# Patient Record
Sex: Female | Born: 1990
Health system: Southern US, Community
[De-identification: ages and names within clinical notes are randomized; demographics above are authoritative.]

## PROBLEM LIST (undated history)

## (undated) ENCOUNTER — Ambulatory Visit: Admission: EM | Payer: 59 | Source: Home / Self Care

## (undated) DIAGNOSIS — R202 Paresthesia of skin: Secondary | ICD-10-CM

## (undated) DIAGNOSIS — N760 Acute vaginitis: Secondary | ICD-10-CM

## (undated) DIAGNOSIS — A599 Trichomoniasis, unspecified: Secondary | ICD-10-CM

## (undated) DIAGNOSIS — A749 Chlamydial infection, unspecified: Secondary | ICD-10-CM

## (undated) DIAGNOSIS — G709 Myoneural disorder, unspecified: Secondary | ICD-10-CM

## (undated) DIAGNOSIS — J40 Bronchitis, not specified as acute or chronic: Secondary | ICD-10-CM

## (undated) DIAGNOSIS — B9689 Other specified bacterial agents as the cause of diseases classified elsewhere: Secondary | ICD-10-CM

## (undated) DIAGNOSIS — L732 Hidradenitis suppurativa: Secondary | ICD-10-CM

## (undated) DIAGNOSIS — K219 Gastro-esophageal reflux disease without esophagitis: Secondary | ICD-10-CM

## (undated) DIAGNOSIS — N390 Urinary tract infection, site not specified: Secondary | ICD-10-CM

## (undated) DIAGNOSIS — A549 Gonococcal infection, unspecified: Secondary | ICD-10-CM

## (undated) HISTORY — PX: SALPINGOOPHORECTOMY: SHX82

---
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
## 2012-03-06 ENCOUNTER — Encounter (HOSPITAL_COMMUNITY): Payer: Self-pay | Admitting: *Deleted

## 2012-03-06 ENCOUNTER — Emergency Department (HOSPITAL_COMMUNITY): Payer: Self-pay

## 2012-03-06 ENCOUNTER — Emergency Department (HOSPITAL_COMMUNITY)
Admission: EM | Admit: 2012-03-06 | Discharge: 2012-03-06 | Disposition: A | Discharge status: Home / Self Care | Payer: Self-pay | Attending: Emergency Medicine | Admitting: Emergency Medicine

## 2012-03-06 DIAGNOSIS — R51 Headache: Secondary | ICD-10-CM | POA: Insufficient documentation

## 2012-03-06 DIAGNOSIS — R0789 Other chest pain: Secondary | ICD-10-CM | POA: Insufficient documentation

## 2012-03-06 DIAGNOSIS — E119 Type 2 diabetes mellitus without complications: Secondary | ICD-10-CM | POA: Insufficient documentation

## 2012-03-06 DIAGNOSIS — Z880 Allergy status to penicillin: Secondary | ICD-10-CM | POA: Insufficient documentation

## 2012-03-06 DIAGNOSIS — E669 Obesity, unspecified: Secondary | ICD-10-CM | POA: Insufficient documentation

## 2012-03-06 DIAGNOSIS — F17200 Nicotine dependence, unspecified, uncomplicated: Secondary | ICD-10-CM | POA: Insufficient documentation

## 2012-03-06 DIAGNOSIS — N949 Unspecified condition associated with female genital organs and menstrual cycle: Secondary | ICD-10-CM | POA: Insufficient documentation

## 2012-03-06 LAB — URINALYSIS, ROUTINE W REFLEX MICROSCOPIC
Bilirubin Urine: NEGATIVE
Hgb urine dipstick: NEGATIVE
Ketones, ur: NEGATIVE mg/dL
Protein, ur: NEGATIVE mg/dL
Urobilinogen, UA: 0.2 mg/dL (ref 0.0–1.0)

## 2012-03-06 MED ORDER — SODIUM CHLORIDE 0.9 % IV BOLUS (SEPSIS)
1000.0000 mL | INTRAVENOUS | Status: AC
  Administered 2012-03-06: 1000 mL via INTRAVENOUS

## 2012-03-06 MED ORDER — KETOROLAC TROMETHAMINE 30 MG/ML IJ SOLN
30.0000 mg | Freq: Once | INTRAMUSCULAR | Status: AC
  Administered 2012-03-06: 30 mg via INTRAVENOUS
  Filled 2012-03-06: qty 1

## 2012-03-06 MED ORDER — DIPHENHYDRAMINE HCL 50 MG/ML IJ SOLN
25.0000 mg | Freq: Once | INTRAMUSCULAR | Status: AC
  Administered 2012-03-06: 25 mg via INTRAVENOUS

## 2012-03-06 MED ORDER — METOCLOPRAMIDE HCL 5 MG/ML IJ SOLN
10.0000 mg | Freq: Once | INTRAMUSCULAR | Status: AC
Start: 2012-03-06 — End: 2012-03-06
  Administered 2012-03-06: 10 mg via INTRAVENOUS
  Filled 2012-03-06: qty 2

## 2012-03-06 NOTE — ED Provider Notes (Signed)
History     CSN: [id number]  Arrival date & time 03/06/12  1302   First MD Initiated Contact with Patient 03/06/12 1625      Chief Complaint  Patient presents with  . Abdominal Cramping  . Back Pain  . Headache    (Consider location/radiation/quality/duration/timing/severity/associated sxs/prior treatment) HPI Comments: [name] Gonzales present for evaluation of a headache.  It began yesterday prior to leaving work.  She describes a throbbing, frontal pain.  She denies NV, photophobia, tinnitus, neck pain/stiffness, fever, toothache, ST.  She took tylenol but awoke with the same headache.  She also reports chest tightness and upper back pain.  She has had a mild cough but it is not productive.  She denies palpitations, leg swelling, recent travel, or fever.  She does take any contraceptive medications.  Lastly she states that since she had surgery Gyn surgery she has had intermittent cramping and an abnormal period (unusual intervals, discomfort, and amt of flow).  She denies any abd or pelvic pain now.  The history is provided by the patient. No language interpreter was used.    Past Medical History  Diagnosis Date  . Diabetes mellitus     History reviewed. No pertinent past surgical history.  History reviewed. No pertinent family history.  History  Substance Use Topics  . Smoking status: Current Every Day Smoker  . Smokeless tobacco: Not on file  . Alcohol Use: No    OB History    Grav Para Term Preterm Abortions TAB SAB Ect Mult Living                  Review of Systems  Constitutional: Positive for chills and fatigue. Negative for fever, diaphoresis, activity change and appetite change.  HENT: Negative for congestion, sore throat, facial swelling, neck pain, neck stiffness, dental problem and postnasal drip.   Eyes: Negative for photophobia, pain, discharge and visual disturbance.  Respiratory: Positive for cough and chest tightness. Negative for shortness of breath  and wheezing.   Cardiovascular: Negative for chest pain, palpitations and leg swelling.  Gastrointestinal: Positive for abdominal pain. Negative for nausea, vomiting, diarrhea, constipation and abdominal distention.  Genitourinary: Negative for dysuria, urgency, hematuria, flank pain, vaginal bleeding, vaginal discharge, enuresis, difficulty urinating, vaginal pain and pelvic pain.  Musculoskeletal: Positive for back pain. Negative for myalgias, joint swelling and arthralgias.  Skin: Negative for color change, pallor, rash and wound.  Neurological: Positive for headaches. Negative for dizziness, seizures, syncope, facial asymmetry, weakness and light-headedness.  Hematological: Negative.   Psychiatric/Behavioral: Negative.     Allergies  Penicillins  Home Medications   Current Outpatient Rx  Name Route Sig Dispense Refill  . VITAMIN D 2000 UNITS PO CAPS Oral Take 1 capsule by mouth daily.    Marland Kitchen EXENATIDE 10 MCG/0.04ML [place] SOLN Subcutaneous Inject 10 mcg into the skin once a week.    . METFORMIN HCL ER 500 MG PO TB24 Oral Take 1,000 mg by mouth daily.    Marland Kitchen PHENTERMINE HCL 37.5 MG PO TABS Oral Take 37.5 mg by mouth daily.      BP 134/74  Pulse 82  Temp 97.8 F (36.6 C) (Oral)  Resp 16  SpO2 100%  Physical Exam  Constitutional: She is oriented to person, place, and time. She appears well-developed and well-nourished. She is active.  Non-toxic appearance. She does not have a sickly appearance. She does not appear ill. No distress. She is not intubated.  HENT:  Head: Normocephalic and atraumatic. Head is  without raccoon's eyes, without Battle's sign, without contusion and without laceration. No trismus in the jaw.  Right Ear: Hearing, tympanic membrane, external ear and ear canal normal. No mastoid tenderness. Tympanic membrane is not bulging. No hemotympanum.  Left Ear: Tympanic membrane and external ear normal. No mastoid tenderness. Tympanic membrane is not bulging. No hemotympanum.  No decreased hearing is noted.  Nose: Nose normal. No mucosal edema, rhinorrhea, sinus tenderness, nasal deformity or nasal septal hematoma. No epistaxis.  Mouth/Throat: Uvula is midline and mucous membranes are normal. Mucous membranes are not pale, not dry and not cyanotic. No oral lesions. No dental abscesses, uvula swelling or dental caries. No oropharyngeal exudate, posterior oropharyngeal edema, posterior oropharyngeal erythema or tonsillar abscesses.  Eyes: EOM and lids are normal. Pupils are equal, round, and reactive to light. Right eye exhibits no discharge, no exudate and no hordeolum. Left eye exhibits no discharge, no exudate and no hordeolum. Right conjunctiva is not injected. Right conjunctiva has no hemorrhage. Left conjunctiva is not injected. Left conjunctiva has no hemorrhage. No scleral icterus. Right eye exhibits normal extraocular motion and no nystagmus. Left eye exhibits normal extraocular motion and no nystagmus. Right pupil is round and reactive. Left pupil is round and reactive. Pupils are equal.  Fundoscopic exam:      The right eye shows no papilledema.       The left eye shows no papilledema.  Neck: Normal range of motion, full passive range of motion without pain and phonation normal. Normal carotid pulses and no JVD present. No tracheal tenderness, no spinous process tenderness and no muscular tenderness present. Carotid bruit is not present. No rigidity. No tracheal deviation, no edema, no erythema and normal range of motion present. No mass and no thyromegaly present.  Cardiovascular: Normal rate, regular rhythm, S1 normal, S2 normal, normal heart sounds, intact distal pulses and normal pulses.   No extrasystoles are present. PMI is not displaced.  Exam reveals no gallop, no distant heart sounds, no friction rub and no decreased pulses.   No murmur heard. Pulmonary/Chest: Breath sounds normal. No accessory muscle usage or stridor. No apnea, not tachypneic and not  bradypneic. She is not intubated. No respiratory distress. She has no decreased breath sounds. She has no wheezes. She has no rhonchi. She has no rales. She exhibits tenderness. She exhibits no mass, no bony tenderness, no laceration, no crepitus, no edema, no deformity, no swelling and no retraction.    Abdominal: Soft. Bowel sounds are normal. She exhibits no distension and no mass. There is no tenderness. There is no rebound and no guarding.  Musculoskeletal: Normal range of motion. She exhibits no edema and no tenderness.  Neurological: She is alert and oriented to person, place, and time.  Skin: Skin is warm and dry. No rash noted. She is not diaphoretic. No erythema. No pallor.  Psychiatric: She has a normal mood and affect. Her behavior is normal.    ED Course  Procedures (including critical care time)   Labs Reviewed  URINALYSIS, ROUTINE W REFLEX MICROSCOPIC  POCT PREGNANCY, URINE   No results found.   No diagnosis found.   Date: 03/06/2012  Rate: 59 bpm  Rhythm: sinus with sinus arrhythmia  QRS Axis: normal  Intervals: normal  ST/T Wave abnormalities: normal  Conduction Disutrbances:none  Narrative Interpretation:   Old EKG Reviewed: none available      MDM  Pt presents for evaluation of a variety of complaints.  She reports some chronic pelvic cramping that is  not currently occuring.  She denies bleeding or discharges, has a negative upreg, nl U/A, no abdominal tenderness on exam.  Pt informed no further evaluation will be performed regarding this complaint.    She also reports a throbbing HA that was only mildly improved with po tylenol.  She denies fever, photophobia, neck pain/stiffness, visual disturbances.  There is no evidenc of CVA, meningitis, sinusitis on exam.  Will provide symptomatic mgmnt and reassess.  Lastly she complains of chest tightness and upper back discomfort.  The back discomfort is not reproducible on exam.  There is reproducible chest wall  tenderness.  Lung sounds are clear.  There is no clinical evidence of thromboembolic event and by Well's criteria pt is low risk for PE.  She does however have risk factors for cardiovascular disease.  Although she is young, she is obese and has type II dm.  Her mother also had a MI and CVA at 43yo.  Plan CXR and EKG for screening.  If significantly abnormal will broaden evaluation.  1938.  Pt stable, NAD.  Plan discharge home.  HA has resolved.  Encouraged outpt f/u with a local PMD and gynecologist.  Will refer to a local cardiologist as she does have cardiovascular risk factors.      Tobin Chad, MD 03/06/12 [phone]

## 2012-03-06 NOTE — ED Notes (Signed)
Pt reports migraine type headache for 2-3 weeks, intermittent in nature. No associated symptoms. Pt reports upper back pain, intermittent, x 1 month, states is getting worse. Pt reports abdominal cramping x unknown amount of time, states it is getting worse. No associated symptoms.

## 2012-11-26 ENCOUNTER — Emergency Department: Payer: Self-pay | Admitting: Emergency Medicine

## 2012-11-26 LAB — BASIC METABOLIC PANEL
Calcium, Total: 8.5 mg/dL — ABNORMAL LOW (ref 9.0–10.7)
Chloride: 107 mmol/L (ref 98–107)
Co2: 27 mmol/L (ref 21–32)
Potassium: 3.4 mmol/L — ABNORMAL LOW (ref 3.5–5.1)
Sodium: 140 mmol/L (ref 136–145)

## 2012-11-26 LAB — CBC
HCT: 37.3 % (ref 35.0–47.0)
HGB: 12.2 g/dL (ref 12.0–16.0)
MCH: 25.6 pg — ABNORMAL LOW (ref 26.0–34.0)
MCHC: 32.8 g/dL (ref 32.0–36.0)
MCV: 78 fL — ABNORMAL LOW (ref 80–100)
Platelet: 363 10*3/uL (ref 150–440)

## 2013-08-10 ENCOUNTER — Encounter (HOSPITAL_COMMUNITY): Payer: Self-pay | Admitting: Emergency Medicine

## 2013-08-10 ENCOUNTER — Emergency Department (HOSPITAL_COMMUNITY)
Admission: EM | Admit: 2013-08-10 | Discharge: 2013-08-10 | Disposition: A | Discharge status: Home / Self Care | Payer: BC Managed Care – PPO | Source: Home / Self Care | Attending: Family Medicine | Admitting: Family Medicine

## 2013-08-10 ENCOUNTER — Emergency Department (INDEPENDENT_AMBULATORY_CARE_PROVIDER_SITE_OTHER): Payer: BC Managed Care – PPO

## 2013-08-10 DIAGNOSIS — J00 Acute nasopharyngitis [common cold]: Secondary | ICD-10-CM

## 2013-08-10 MED ORDER — IPRATROPIUM BROMIDE 0.06 % NA SOLN: 2.0000 | Freq: Four times a day (QID) | NASAL | Status: DC

## 2013-08-10 MED ORDER — DEXTROMETHORPHAN POLISTIREX 30 MG/5ML PO LQCR: 30.0000 mg | Freq: Two times a day (BID) | ORAL | Status: DC | PRN

## 2013-08-10 NOTE — ED Notes (Signed)
Reported URI type syx since 2-16; cough, runny nose

## 2013-08-10 NOTE — ED Provider Notes (Signed)
Medical screening examination/treatment/procedure(s) were performed by resident physician or non-physician practitioner and as supervising physician I was immediately available for consultation/collaboration.   Betha Shadix DOUGLAS MD.   Astou Lada D Susette Seminara, MD 08/10/13 1435 

## 2013-08-10 NOTE — ED Provider Notes (Signed)
CSN: [id number]     Arrival date & time 08/10/13  1000 History   None    Chief Complaint  Patient presents with  . URI     (Consider location/radiation/quality/duration/timing/severity/associated sxs/prior Treatment) HPI Comments: Patient presents with 4-5 days of nasal congestion, cough, cough, rhinorrhea, watery eyes, throat irritation, and intermittent headaches without reports of fever.  States her chest is quite sore from coughing. PCP: in [place], [place]. 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  Non-smoker  Patient is a 23 y.o. female presenting with URI. The history is provided by the patient.  URI   Past Medical History  Diagnosis Date  . Diabetes mellitus    Past Surgical History  Procedure Laterality Date  . Salpingoophorectomy     No family history on file. History  Substance Use Topics  . Smoking status: Current Every Day Smoker  . Smokeless tobacco: Not on file  . Alcohol Use: No   OB History   Grav Para Term Preterm Abortions TAB SAB Ect Mult Living                 Review of Systems  All other systems reviewed and are negative.      Allergies  Penicillins  Home Medications   Current Outpatient Rx  Name  Route  Sig  Dispense  Refill  . Cholecalciferol (VITAMIN D) 2000 UNITS CAPS   Oral   Take 1 capsule by mouth daily.         . metFORMIN (GLUCOPHAGE-XR) 500 MG 24 hr tablet   Oral   Take 1,000 mg by mouth daily.         . norgestrel-ethinyl estradiol (LO/OVRAL,CRYSELLE) 0.3-30 MG-MCG tablet   Oral   Take 1 tablet by mouth daily.         . phentermine (ADIPEX-P) 37.5 MG tablet   Oral   Take 37.5 mg by mouth daily.         Marland Kitchen dextromethorphan (DELSYM) 30 MG/5ML liquid   Oral   Take 5 mLs (30 mg total) by mouth 2 (two) times daily as needed for cough.   90 mL   0   . exenatide (BYETTA) 10 MCG/0.04ML SOLN   Subcutaneous   Inject 10 mcg into the skin once a week.         Marland Kitchen ipratropium (ATROVENT) 0.06 % nasal spray   Each  Nare   Place 2 sprays into both nostrils 4 (four) times daily.   15 mL   0    BP 127/82  Pulse 76  Temp(Src) 97.8 F (36.6 C) (Oral)  Resp 18  SpO2 100% Physical Exam  Nursing note and vitals reviewed. Constitutional: She is oriented to person, place, and time. She appears well-developed.  +mobidly obese  HENT:  Head: Normocephalic and atraumatic.  Right Ear: Hearing, external ear and ear canal normal.  Left Ear: Hearing, external ear and ear canal normal.  Nose: Nose normal.  Mouth/Throat: Uvula is midline, oropharynx is clear and moist and mucous membranes are normal. No uvula swelling.  Visualization of both TMs obstructed by cerumen  Eyes: Conjunctivae are normal. Right eye exhibits no discharge. Left eye exhibits no discharge. No scleral icterus.  Neck: Normal range of motion. Neck supple.  Cardiovascular: Normal rate, regular rhythm and normal heart sounds.   Pulmonary/Chest: Effort normal and breath sounds normal. No respiratory distress. She has no wheezes.  Musculoskeletal: Normal range of motion.  Lymphadenopathy:    She has no cervical adenopathy.  Neurological: She is alert and oriented to person, place, and time.  Skin: Skin is warm and dry.  Psychiatric: She has a normal mood and affect. Her behavior is normal.    ED Course  Procedures (including critical care time) Labs Review Labs Reviewed - No data to display Imaging Review Dg Chest 2 View  08/10/2013   CLINICAL DATA:  Productive cough and chest pain  EXAM: CHEST  2 VIEW  COMPARISON:  March 06, 2012  FINDINGS: There is no edema or consolidation. The heart size and pulmonary vascularity are within normal limits. No appreciable adenopathy. No bone lesions.  IMPRESSION: No edema or consolidation.   Electronically Signed   By: Bretta BangWilliam  Woodruff M.D.   On: 08/10/2013 11:17      MDM   Final diagnoses:  Common cold  CXR: unremarkable. Exam consistent with common cold. Will advise warm salt water  gargles, tylenol or ibuprofen, atrovent nasal spray and delsym for cough. PCP or UCC follow up if no improvement in the next 3-4 days.    Jess BartersJennifer Lee [place], [place] 08/10/13 1134

## 2013-08-10 NOTE — Discharge Instructions (Signed)
Your chest xray was normal.

## 2013-08-30 ENCOUNTER — Emergency Department (HOSPITAL_COMMUNITY)
Admission: EM | Admit: 2013-08-30 | Discharge: 2013-08-30 | Disposition: A | Discharge status: Home / Self Care | Payer: BC Managed Care – PPO | Source: Home / Self Care | Attending: Family Medicine | Admitting: Family Medicine

## 2013-08-30 ENCOUNTER — Encounter (HOSPITAL_COMMUNITY): Payer: Self-pay | Admitting: Emergency Medicine

## 2013-08-30 DIAGNOSIS — L0501 Pilonidal cyst with abscess: Secondary | ICD-10-CM

## 2013-08-30 DIAGNOSIS — Z5189 Encounter for other specified aftercare: Secondary | ICD-10-CM

## 2013-08-30 NOTE — ED Notes (Signed)
C/o abscess on tailbone States she was seen by her [place] in [place] and was given medication and packing States area is sore and has been draining blood  Patient wanted to make sure area was not infected

## 2013-08-30 NOTE — ED Provider Notes (Signed)
Medical screening examination/treatment/procedure(s) were performed by a resident physician or non-physician practitioner and as the supervising physician I was immediately available for consultation/collaboration.  Swayze Pries, MD    Sejla Marzano S Gradie Ohm, MD 08/30/13 1412 

## 2013-08-30 NOTE — Discharge Instructions (Signed)
Abscess An abscess is an infected area that contains a collection of pus and debris.It can occur in almost any part of the body. An abscess is also known as a furuncle or boil. CAUSES  An abscess occurs when tissue gets infected. This can occur from blockage of oil or sweat glands, infection of hair follicles, or a minor injury to the skin. As the body tries to fight the infection, pus collects in the area and creates pressure under the skin. This pressure causes pain. People with weakened immune systems have difficulty fighting infections and get certain abscesses more often.  SYMPTOMS Usually an abscess develops on the skin and becomes a painful mass that is red, warm, and tender. If the abscess forms under the skin, you may feel a moveable soft area under the skin. Some abscesses break open (rupture) on their own, but most will continue to get worse without care. The infection can spread deeper into the body and eventually into the bloodstream, causing you to feel ill.  DIAGNOSIS  Your caregiver will take your medical history and perform a physical exam. A sample of fluid may also be taken from the abscess to determine what is causing your infection. TREATMENT  Your caregiver may prescribe antibiotic medicines to fight the infection. However, taking antibiotics alone usually does not cure an abscess. Your caregiver may need to make a small cut (incision) in the abscess to drain the pus. In some cases, gauze is packed into the abscess to reduce pain and to continue draining the area. HOME CARE INSTRUCTIONS   Only take over-the-counter or prescription medicines for pain, discomfort, or fever as directed by your caregiver.  If you were prescribed antibiotics, take them as directed. Finish them even if you start to feel better.  If gauze is used, follow your caregiver's directions for changing the gauze.  To avoid spreading the infection:  Keep your draining abscess covered with a  bandage.  Wash your hands well.  Do not share personal care items, towels, or whirlpools with others.  Avoid skin contact with others.  Keep your skin and clothes clean around the abscess.  Keep all follow-up appointments as directed by your caregiver. SEEK MEDICAL CARE IF:   You have increased pain, swelling, redness, fluid drainage, or bleeding.  You have muscle aches, chills, or a general ill feeling.  You have a fever. MAKE SURE YOU:   Understand these instructions.  Will watch your condition.  Will get help right away if you are not doing well or get worse. Document Released: 03/17/2005 Document Revised: 12/07/2011 Document Reviewed: 08/20/2011 Exit[employer] Patient Information 2014 [employer].  

## 2013-08-30 NOTE — ED Provider Notes (Signed)
CSN: [id number]     Arrival date & time 08/30/13  1221 History   First MD Initiated Contact with Patient 08/30/13 1232     Chief Complaint  Patient presents with  . Abscess   (Consider location/radiation/quality/duration/timing/severity/associated sxs/prior Treatment) HPI Comments: Patient reports she developed skin abscess (pilonodal) and had I&D procedure [place]. Has been changing packing at home and taking TMP/SMX as prescribed. Denies any difficulty with wound since procedure. No fever/chills. Is here only for wound re-check.   Patient is a 23 y.o. female presenting with abscess. The history is provided by the patient.  Abscess   Past Medical History  Diagnosis Date  . Diabetes mellitus    Past Surgical History  Procedure Laterality Date  . Salpingoophorectomy     History reviewed. No pertinent family history. History  Substance Use Topics  . Smoking status: Current Every Day Smoker  . Smokeless tobacco: Not on file  . Alcohol Use: No   OB History   Grav Para Term Preterm Abortions TAB SAB Ect Mult Living                 Review of Systems  All other systems reviewed and are negative.    Allergies  Penicillins  Home Medications   Current Outpatient Rx  Name  Route  Sig  Dispense  Refill  . Cholecalciferol (VITAMIN D) 2000 UNITS CAPS   Oral   Take 1 capsule by mouth daily.         Marland Kitchen. dextromethorphan (DELSYM) 30 MG/5ML liquid   Oral   Take 5 mLs (30 mg total) by mouth 2 (two) times daily as needed for cough.   90 mL   0   . exenatide (BYETTA) 10 MCG/0.04ML SOLN   Subcutaneous   Inject 10 mcg into the skin once a week.         Marland Kitchen. ipratropium (ATROVENT) 0.06 % nasal spray   Each Nare   Place 2 sprays into both nostrils 4 (four) times daily.   15 mL   0   . metFORMIN (GLUCOPHAGE-XR) 500 MG 24 hr tablet   Oral   Take 1,000 mg by mouth daily.         . norgestrel-ethinyl estradiol (LO/OVRAL,CRYSELLE) 0.3-30 MG-MCG tablet  Oral   Take 1 tablet by mouth daily.         . phentermine (ADIPEX-P) 37.5 MG tablet   Oral   Take 37.5 mg by mouth daily.          BP 146/77  Pulse 90  Temp(Src) 98.7 F (37.1 C) (Oral)  Resp 16  SpO2 100%  LMP 08/07/2013 Physical Exam  Nursing note and vitals reviewed. Constitutional: She is oriented to person, place, and time. She appears well-developed and well-nourished. No distress.  +obese  HENT:  Head: Normocephalic and atraumatic.  Eyes: Conjunctivae are normal.  Cardiovascular: Normal rate.   Pulmonary/Chest: Effort normal.  Musculoskeletal: Normal range of motion.  Neurological: She is alert and oriented to person, place, and time.  Skin: Skin is warm and dry.  Very small (2-513mm) residual opening at pilonidal area with no physical evidence of infection. No drainage or bleeding.   Psychiatric: She has a normal mood and affect. Her behavior is normal.    ED Course  Procedures (including critical care time) Labs Review Labs Reviewed - No data to display Imaging Review No results found.   MDM   1. Pilonidal abscess   2. Wound  check, abscess   Healing well. Continue wound care and meds as prescribed at home.    Jess Barters [place], [place] 08/30/13 1330

## 2013-10-11 ENCOUNTER — Ambulatory Visit (INDEPENDENT_AMBULATORY_CARE_PROVIDER_SITE_OTHER): Payer: BC Managed Care – PPO | Admitting: General Surgery

## 2013-10-18 ENCOUNTER — Encounter (INDEPENDENT_AMBULATORY_CARE_PROVIDER_SITE_OTHER): Payer: Self-pay

## 2013-10-18 ENCOUNTER — Ambulatory Visit (INDEPENDENT_AMBULATORY_CARE_PROVIDER_SITE_OTHER): Payer: BC Managed Care – PPO | Admitting: General Surgery

## 2013-10-18 ENCOUNTER — Encounter (INDEPENDENT_AMBULATORY_CARE_PROVIDER_SITE_OTHER): Payer: Self-pay | Admitting: General Surgery

## 2013-10-18 VITALS — BP 124/72 | HR 77 | Temp 97.5°F | Ht 67.0 in | Wt 296.4 lb

## 2013-10-18 DIAGNOSIS — L988 Other specified disorders of the skin and subcutaneous tissue: Secondary | ICD-10-CM | POA: Insufficient documentation

## 2013-10-18 NOTE — Progress Notes (Signed)
Chief Complaint  Patient presents with  . Cyst    neck and buttocks    HISTORY:  [name] Gonzales is a 23 y.o. female who presents to the office with a pilonidal cyst.  She is s/p I&D of an infected cyst on her neck and has completed a course of antibiotics for this.  She states that she has had recurrent infections of "cysts" and underwent a surgical resection in [place] 2 yrs ago for another one on her neck.  She is here today to discuss her pilonidal infection.      Past Medical History  Diagnosis Date  . Diabetes mellitus        Past Surgical History  Procedure Laterality Date  . Salpingoophorectomy    . Oophorectomy  2011      Current Outpatient Prescriptions  Medication Sig Dispense Refill  . Cholecalciferol (VITAMIN D) 2000 UNITS CAPS Take 1 capsule by mouth daily.      Marland Kitchen. esomeprazole (NEXIUM) 20 MG capsule Take 20 mg by mouth daily at 12 noon.      . metFORMIN (GLUCOPHAGE-XR) 500 MG 24 hr tablet Take 1,000 mg by mouth daily.      . norgestrel-ethinyl estradiol (LO/OVRAL,CRYSELLE) 0.3-30 MG-MCG tablet Take 1 tablet by mouth daily.      . phentermine (ADIPEX-P) 37.5 MG tablet Take 37.5 mg by mouth daily.       No current facility-administered medications for this visit.     Allergies  Allergen Reactions  . Penicillins Hives and Swelling      History reviewed. No pertinent family history.    History   Social History  . Marital Status: Single    Spouse Name: N/A    Number of Children: N/A  . Years of Education: N/A   Social History Main Topics  . Smoking status: Current Every Day Smoker -- 0.25 packs/day    Types: Cigarettes  . Smokeless tobacco: None  . Alcohol Use: No  . Drug Use: None  . Sexual Activity: None   Other Topics Concern  . None   Social History Narrative  . None       REVIEW OF SYSTEMS - PERTINENT POSITIVES ONLY: Review of Systems - General ROS: negative for - chills or fever Respiratory ROS: no cough, shortness of breath, or  wheezing Cardiovascular ROS: no chest pain or dyspnea on exertion Gastrointestinal ROS: no abdominal pain, change in bowel habits, or black or bloody stools  EXAM: Filed Vitals:   10/18/13 1409  BP: 124/72  Pulse: 77  Temp: 97.5 F (36.4 C)    General appearance: alert and cooperative Resp: clear to auscultation bilaterally Cardio: regular rate and rhythm GI: normal findings: soft, non-tender Small draining nodule, in presacral space, some purulence expressed.  Several pits noted up high   ASSESSMENT AND PLAN: [name] Gonzales is a 23 y.o. F with pilonidal disease.  I have given her the option of watching and waiting vs resection.  She has decided to wait on this right now.  She will call the office if her symptoms progress or worsen.  If she continues to have problems with the cyst on her neck, I have recommended that she return to the surgeon who resected her other cyst.      Vanita PandaAlicia C Kimari Lienhard, MD Colon and Rectal Surgery / General Surgery [facility]      Visit Diagnoses: 1. Pilonidal disease     Primary Care Physician: No PCP Per Patient

## 2013-10-18 NOTE — Patient Instructions (Signed)
Pilonidal Disease  What is pilonidal disease and what causes it? Pilonidal disease is a chronic infection of the skin in the region of the buttock crease (Figure 1). The condition results from a reaction to hairs embedded in the skin, commonly occurring in the cleft between the buttocks. The disease is more common in men than women and frequently occurs between puberty and age 21. It is also common in obese people and those with thick, stiff body hair.  Figure 1: Pilonidal disease is a chronic skin infection in the buttock crease area. Two small openings are shown (A).  What are the symptoms? Symptoms vary from a small dimple to a large painful mass. Often the area will drain fluid that may be clear, cloudy or bloody. With infection, the area becomes red, tender, and the drainage (pus) will have a foul odor. The infection may also cause fever, malaise, or nausea. There are several common patterns of this disease. Nearly all patients have an episode of an acute abscess (the area is swollen, tender, and may drain pus). After the abscess resolves, either by itself or with medical assistance, many patients develop a pilonidal sinus. The sinus is a cavity below the skin surface that connects to the surface with one or more small openings or tracts. Although a few of these sinus tracts may resolve without therapy, most patients need a small operation to eliminate them. A small number of patients develop recurrent infections and inflammation of these sinus tracts. The chronic disease causes episodes of swelling, pain, and drainage. Surgery is almost always required to resolve this condition.  How is pilonidal disease treated? The treatment depends on the disease pattern. An acute abscess is managed with an incision and drained to release the pus, and reduce the inflammation and pain. This procedure usually can be performed in the office with local anesthesia. A chronic sinus usually will need to be excised or  surgically opened. Complex or recurrent disease must be treated surgically. Procedures vary from unroofing the sinuses to excision (Figure 2) and possible closure with flaps. Larger operations require longer healing times. If the wound is left open, it will require dressing or packing to keep it clean. Although it may take several weeks to heal, the success rate with open wounds is higher. Closure with flaps is a bigger operation that has a higher chance of infection; however, it may be required in some patients. Your surgeon will discuss these options with you and help you select the appropriate operation.   Figure 2: Drawing B is a side view showing how most of the inflammation is deep under the skin just outside the coccyx (tailbone). The dashed line shows how it may be opened or unroofed. Dashed line in drawing C shows excision of all inflamed tissue.  What care is required after surgery? If the wound can be closed, it will need to be kept clean and dry until the skin is completely healed. If the wound must be left open, dressings or packing will be needed to help remove secretions and to allow the wound to heal from the bottom up. After healing, the skin in the buttocks crease must be kept clean and free of hair. This is accomplished by shaving or using a hair removal agent every two or three weeks until age 21. After age 21, the hair shaft thins, becomes softer and the buttock cleft becomes less deep. What is a colon and rectal surgeon? Colon and rectal surgeons are experts in the  surgical and non-surgical treatment of diseases of the colon, rectum and anus. They have completed advanced surgical training in the treatment of these diseases as well as full general surgical training. Board-certified colon and rectal surgeons complete residencies in general surgery and colon and rectal surgery, and pass intensive examinations conducted by the American Board of Surgery and the American Board of Colon and  Rectal Surgery. They are well-versed in the treatment of both benign and malignant diseases of the colon, rectum and anus and are able to perform routine screening examinations and surgically treat conditions if indicated to do so.  2012 American Society of Colon & Rectal Surgeons   

## 2013-11-20 ENCOUNTER — Encounter: Payer: BC Managed Care – PPO | Admitting: Family Medicine

## 2014-01-22 ENCOUNTER — Encounter (INDEPENDENT_AMBULATORY_CARE_PROVIDER_SITE_OTHER): Payer: Self-pay | Admitting: General Surgery

## 2014-01-22 ENCOUNTER — Ambulatory Visit (INDEPENDENT_AMBULATORY_CARE_PROVIDER_SITE_OTHER): Payer: BC Managed Care – PPO | Admitting: General Surgery

## 2014-01-22 VITALS — BP 126/82 | HR 80 | Temp 97.2°F | Ht 68.0 in | Wt 291.0 lb

## 2014-01-22 DIAGNOSIS — L732 Hidradenitis suppurativa: Secondary | ICD-10-CM

## 2014-01-22 NOTE — Patient Instructions (Signed)
Continue antibiotics for now.  Call the office if you'd like to discuss surgery further.

## 2014-01-22 NOTE — Progress Notes (Signed)
[name] Gonzales is a 23 y.o. female who is here for a follow up visit regarding her skin problems.  She has a know pilonidal cyst and is s/p excision of what sounds like a sebaceous cyst from the back of her neck.  She was seen in urgent care recently for a recurrence of inflammation under her right breast.  She is on Keflex.    Objective: Filed Vitals:   01/22/14 1101  BP: 126/82  Pulse: 80  Temp: 97.2 F (36.2 C)    General appearance: alert and cooperative subcutaneous mass in infra-mammary fold of right breast c/w hidradenitis.     Assessment and Plan: Continue abx for now.  My need excision in the future if continues to get worse.  F/U PRN    Vanita PandaAlicia C Loralie Malta, MD [facility], [place] [phone]

## 2014-03-01 ENCOUNTER — Other Ambulatory Visit (INDEPENDENT_AMBULATORY_CARE_PROVIDER_SITE_OTHER): Payer: Self-pay

## 2014-03-01 DIAGNOSIS — L732 Hidradenitis suppurativa: Secondary | ICD-10-CM

## 2014-03-01 DIAGNOSIS — B373 Candidiasis of vulva and vagina: Secondary | ICD-10-CM

## 2014-03-01 DIAGNOSIS — B3731 Acute candidiasis of vulva and vagina: Secondary | ICD-10-CM

## 2014-03-01 MED ORDER — FLUCONAZOLE 150 MG PO TABS: 150.0000 mg | ORAL_TABLET | Freq: Every day | ORAL | Status: DC

## 2014-03-01 MED ORDER — DOXYCYCLINE HYCLATE 100 MG PO CAPS: 100.0000 mg | ORAL_CAPSULE | Freq: Every day | ORAL | Status: DC

## 2014-04-23 ENCOUNTER — Emergency Department (HOSPITAL_COMMUNITY): Payer: BC Managed Care – PPO

## 2014-04-23 ENCOUNTER — Encounter (HOSPITAL_COMMUNITY): Payer: Self-pay

## 2014-04-23 ENCOUNTER — Emergency Department (HOSPITAL_COMMUNITY)
Admission: EM | Admit: 2014-04-23 | Discharge: 2014-04-23 | Disposition: A | Discharge status: Home / Self Care | Payer: BC Managed Care – PPO | Attending: Emergency Medicine | Admitting: Emergency Medicine

## 2014-04-23 DIAGNOSIS — R509 Fever, unspecified: Secondary | ICD-10-CM | POA: Diagnosis not present

## 2014-04-23 DIAGNOSIS — R1010 Upper abdominal pain, unspecified: Secondary | ICD-10-CM

## 2014-04-23 DIAGNOSIS — Z880 Allergy status to penicillin: Secondary | ICD-10-CM | POA: Insufficient documentation

## 2014-04-23 DIAGNOSIS — R1013 Epigastric pain: Secondary | ICD-10-CM | POA: Insufficient documentation

## 2014-04-23 DIAGNOSIS — Z792 Long term (current) use of antibiotics: Secondary | ICD-10-CM | POA: Diagnosis not present

## 2014-04-23 DIAGNOSIS — Z87891 Personal history of nicotine dependence: Secondary | ICD-10-CM | POA: Diagnosis not present

## 2014-04-23 DIAGNOSIS — Z793 Long term (current) use of hormonal contraceptives: Secondary | ICD-10-CM | POA: Diagnosis not present

## 2014-04-23 DIAGNOSIS — Z79899 Other long term (current) drug therapy: Secondary | ICD-10-CM | POA: Diagnosis not present

## 2014-04-23 DIAGNOSIS — Z9089 Acquired absence of other organs: Secondary | ICD-10-CM | POA: Diagnosis not present

## 2014-04-23 DIAGNOSIS — R945 Abnormal results of liver function studies: Secondary | ICD-10-CM

## 2014-04-23 DIAGNOSIS — R110 Nausea: Secondary | ICD-10-CM | POA: Insufficient documentation

## 2014-04-23 DIAGNOSIS — R7989 Other specified abnormal findings of blood chemistry: Secondary | ICD-10-CM | POA: Insufficient documentation

## 2014-04-23 DIAGNOSIS — E119 Type 2 diabetes mellitus without complications: Secondary | ICD-10-CM | POA: Diagnosis not present

## 2014-04-23 DIAGNOSIS — R109 Unspecified abdominal pain: Secondary | ICD-10-CM

## 2014-04-23 LAB — I-STAT TROPONIN, ED: Troponin i, poc: 0.02 ng/mL (ref 0.00–0.08)

## 2014-04-23 LAB — CBC WITH DIFFERENTIAL/PLATELET
Basophils Absolute: 0 10*3/uL (ref 0.0–0.1)
Basophils Relative: 1 % (ref 0–1)
Eosinophils Absolute: 0 10*3/uL (ref 0.0–0.7)
Eosinophils Relative: 1 % (ref 0–5)
HCT: 38.2 % (ref 36.0–46.0)
HEMOGLOBIN: 12.5 g/dL (ref 12.0–15.0)
LYMPHS ABS: 2.1 10*3/uL (ref 0.7–4.0)
Lymphocytes Relative: 34 % (ref 12–46)
MCH: 25 pg — ABNORMAL LOW (ref 26.0–34.0)
MCHC: 32.7 g/dL (ref 30.0–36.0)
MCV: 76.2 fL — ABNORMAL LOW (ref 78.0–100.0)
MONOS PCT: 4 % (ref 3–12)
Monocytes Absolute: 0.2 10*3/uL (ref 0.1–1.0)
NEUTROS PCT: 60 % (ref 43–77)
Neutro Abs: 3.8 10*3/uL (ref 1.7–7.7)
Platelets: 415 10*3/uL — ABNORMAL HIGH (ref 150–400)
RBC: 5.01 MIL/uL (ref 3.87–5.11)
RDW: 15.7 % — ABNORMAL HIGH (ref 11.5–15.5)
WBC: 6.2 10*3/uL (ref 4.0–10.5)

## 2014-04-23 LAB — COMPREHENSIVE METABOLIC PANEL
ALBUMIN: 3.8 g/dL (ref 3.5–5.2)
ALK PHOS: 43 U/L (ref 39–117)
ALT: 176 U/L — AB (ref 0–35)
ANION GAP: 13 (ref 5–15)
AST: 274 U/L — ABNORMAL HIGH (ref 0–37)
BILIRUBIN TOTAL: 0.9 mg/dL (ref 0.3–1.2)
BUN: 6 mg/dL (ref 6–23)
CHLORIDE: 101 meq/L (ref 96–112)
CO2: 25 mEq/L (ref 19–32)
Calcium: 9 mg/dL (ref 8.4–10.5)
Creatinine, Ser: 0.55 mg/dL (ref 0.50–1.10)
GFR calc Af Amer: >90 mL/min (ref >90)
GFR calc non Af Amer: >90 mL/min (ref >90)
Glucose, Bld: 117 mg/dL — ABNORMAL HIGH (ref 70–99)
Potassium: 3.9 mEq/L (ref 3.7–5.3)
SODIUM: 139 meq/L (ref 137–147)
Total Protein: 7.2 g/dL (ref 6.0–8.3)

## 2014-04-23 LAB — LIPASE, BLOOD: Lipase: 31 U/L (ref 11–59)

## 2014-04-23 MED ORDER — SUCRALFATE 1 G PO TABS: 1.0000 g | ORAL_TABLET | Freq: Three times a day (TID) | ORAL | Status: DC

## 2014-04-23 NOTE — ED Provider Notes (Addendum)
CSN: [id number]     Arrival date & time 04/23/14  1403 History   First MD Initiated Contact with Patient 04/23/14 1717     Chief Complaint  Patient presents with  . Abdominal Pain   Patient is a 23 y.o. female presenting with abdominal pain. The history is provided by the patient.  Abdominal Pain Pain location:  Epigastric Pain quality: burning   Pain radiates to:  Does not radiate Pain severity:  Moderate Onset quality:  Gradual Duration:  10 hours Timing:  Constant Progression:  Improving (still very sore but the burning is getting better) Context: not recent illness and not recent travel   Relieved by:  Nothing Exacerbated by: food didnt make it worse but drinkiing did. Ineffective treatments:  Antacids Associated symptoms: fever and nausea   Associated symptoms: no dysuria, no vaginal bleeding, no vaginal discharge and no vomiting     Past Medical History  Diagnosis Date  . Diabetes mellitus    Past Surgical History  Procedure Laterality Date  . Salpingoophorectomy    . Oophorectomy  2011   No family history on file. History  Substance Use Topics  . Smoking status: Former Smoker -- 0.25 packs/day    Types: Cigarettes    Quit date: 01/19/2014  . Smokeless tobacco: Not on file  . Alcohol Use: No   OB History    No data available     Review of Systems  Constitutional: Positive for fever.  Gastrointestinal: Positive for nausea and abdominal pain. Negative for vomiting.  Genitourinary: Negative for dysuria, vaginal bleeding and vaginal discharge.  All other systems reviewed and are negative.     Allergies  Penicillins  Home Medications   Prior to Admission medications   Medication Sig Start Date End Date Taking? Authorizing Provider  Cholecalciferol (VITAMIN D) 2000 UNITS CAPS Take 1 capsule by mouth daily.    Historical Provider, MD  doxycycline (VIBRAMYCIN) 100 MG capsule Take 1 capsule (100 mg total) by mouth daily. 03/01/14   Valarie MerinoMatthew B Martin, MD   esomeprazole (NEXIUM) 20 MG capsule Take 20 mg by mouth daily at 12 noon.    Historical Provider, MD  fluconazole (DIFLUCAN) 150 MG tablet Take 1 tablet (150 mg total) by mouth daily. 03/01/14   Valarie MerinoMatthew B Martin, MD  metFORMIN (GLUCOPHAGE-XR) 500 MG 24 hr tablet Take 1,000 mg by mouth daily.    Historical Provider, MD  norgestrel-ethinyl estradiol (LO/OVRAL,CRYSELLE) 0.3-30 MG-MCG tablet Take 1 tablet by mouth daily.    Historical Provider, MD  phentermine (ADIPEX-P) 37.5 MG tablet Take 37.5 mg by mouth daily.    Historical Provider, MD   BP 132/73 mmHg  Pulse 88  Temp(Src) 98 F (36.7 C) (Oral)  Resp 22  Ht 5\' 8"  (1.727 m)  Wt 298 lb (135.172 kg)  BMI 45.32 kg/m2  SpO2 99%  LMP 04/19/2014 Physical Exam  Constitutional: She appears well-developed and well-nourished. No distress.  HENT:  Head: Normocephalic and atraumatic.  Right Ear: External ear normal.  Left Ear: External ear normal.  Eyes: Conjunctivae are normal. Right eye exhibits no discharge. Left eye exhibits no discharge. No scleral icterus.  Neck: Neck supple. No tracheal deviation present.  Cardiovascular: Normal rate, regular rhythm and intact distal pulses.   Pulmonary/Chest: Effort normal and breath sounds normal. No stridor. No respiratory distress. She has no wheezes. She has no rales.  Abdominal: Soft. Bowel sounds are normal. She exhibits no distension. There is tenderness in the epigastric area. There is no rebound and  no guarding.  Musculoskeletal: She exhibits no edema or tenderness.  Neurological: She is alert. She has normal strength. No cranial nerve deficit (no facial droop, extraocular movements intact, no slurred speech) or sensory deficit. She exhibits normal muscle tone. She displays no seizure activity. Coordination normal.  Skin: Skin is warm and dry. No rash noted.  Psychiatric: She has a normal mood and affect.  Nursing note and vitals reviewed.   ED Course  Procedures (including critical care  time) Labs Review Labs Reviewed  CBC WITH DIFFERENTIAL - Abnormal; Notable for the following:    MCV 76.2 (*)    MCH 25.0 (*)    RDW 15.7 (*)    Platelets 415 (*)    All other components within normal limits  COMPREHENSIVE METABOLIC PANEL - Abnormal; Notable for the following:    Glucose, Bld 117 (*)    AST 274 (*)    ALT 176 (*)    All other components within normal limits  LIPASE, BLOOD  I-STAT TROPOININ, ED    Imaging Review Dg Chest 2 View  04/23/2014   CLINICAL DATA:  Productive cough. Shortness of breath. Mid lower chest pain in generalized burning abdominal pain and fever.  EXAM: CHEST  2 VIEW  COMPARISON:  One-view chest 08/10/2013.  FINDINGS: The RA scratch the the heart size is normal. The lung volumes are low. No focal airspace disease is evident.  IMPRESSION: 1. Low lung volumes. 2. No acute cardiopulmonary disease.   Electronically Signed   By: Gennette Pachris  Mattern M.D.   On: 04/23/2014 15:54   [place] Abdomen Complete  04/23/2014   CLINICAL DATA:  Abdominal pain.  EXAM: ULTRASOUND ABDOMEN COMPLETE  COMPARISON:  None.  FINDINGS: Gallbladder: No gallstones or wall thickening visualized. No sonographic Murphy sign noted.  Common bile duct: Diameter: 0.4 cm  Liver: Liver parenchyma is slightly echogenic. No focal liver lesion and no evidence for intrahepatic biliary dilatation.  IVC: No abnormality visualized.  Pancreas: Pancreatic body and tail are obscured by bowel gas.  Spleen: Size and appearance within normal limits.  Right Kidney: Length: 12.9 cm. Echogenicity within normal limits. No mass or hydronephrosis visualized.  Left Kidney: Length: 13.0 cm. Left kidney is not well visualized. No evidence for left hydronephrosis. No gross abnormality to the left kidney.  Abdominal aorta: No aneurysm visualized.  Other findings: None.  IMPRESSION: No acute abnormality.   Electronically Signed   By: Richarda OverlieAdam  Henn M.D.   On: 04/23/2014 19:05     EKG Interpretation   Date/Time:  Tuesday April 23 2014 14:33:04 EST Ventricular Rate:  82 PR Interval:  150 QRS Duration: 80 QT Interval:  382 QTC Calculation: 446 R Axis:   73 Text Interpretation:  Normal sinus rhythm Nonspecific T wave abnormality  Abnormal ECG No significant change since last tracing Confirmed by Peng Thorstenson   MD-J, Braylen Denunzio ([id number]) on 04/23/2014 5:30:01 PM      MDM   Final diagnoses:  Pain of upper abdomen  Elevated liver function tests    Patient's ultrasound does not show evidence of gallstones. She does have a history of gastroesophageal reflux disease. She has elevated liver enzymes.  No evidence to suggest lactic acidosis.  I'll have the patient follow-up with her primary care doctor to have repeat liver testing, possibly hepatitis panel. Add Carafate to her medical regimen.    Linwood DibblesJon Trinidad Ingle, MD 04/23/14 1930  Linwood DibblesJon Sahaj Bona, MD 05/01/14 [id number]

## 2014-04-23 NOTE — ED Notes (Signed)
Pt states she started having a burning feeling in her upper abd this morning. Takes nexium but nothing has been helping. No vomiting or diarrhea.

## 2014-04-23 NOTE — ED Notes (Signed)
Pt undressed, in gown, on monitor, continuous pulse oximetry and blood pressure cuff; warm blanket given by Samson FredericJ, EMT

## 2014-04-23 NOTE — Discharge Instructions (Signed)

## 2014-06-05 ENCOUNTER — Telehealth (INDEPENDENT_AMBULATORY_CARE_PROVIDER_SITE_OTHER): Payer: Self-pay

## 2014-06-05 DIAGNOSIS — L732 Hidradenitis suppurativa: Secondary | ICD-10-CM

## 2014-06-05 MED ORDER — SULFAMETHOXAZOLE-TRIMETHOPRIM 400-80 MG PO TABS: 1.0000 | ORAL_TABLET | Freq: Two times a day (BID) | ORAL | Status: AC

## 2014-06-05 NOTE — Telephone Encounter (Signed)
That's fine.  Bactrim rx sent to pharmacy.  If there is any reason she could be pregnant, please let us know, and I can change the antibiotic.

## 2014-06-05 NOTE — Addendum Note (Signed)
Addended by: Vanita PandaHOMAS, Talon Regala C. on: 06/05/2014 01:17 PM   Modules accepted: Orders

## 2014-06-05 NOTE — Telephone Encounter (Signed)
Informed pt that Bactrim was sent to pharmacy. Pt states that she is not pregnant

## 2014-06-05 NOTE — Telephone Encounter (Signed)
Pt s/p hidradenitis. She has been seen in the office several times by multiple physicians. Pt would like to know if Dr Maisie Fushomas would give her another round of abx's. Last seen in the clinic on 03/18/14 by Dr Maisie Fushomas. Pt was on Bactrim at that time. Pt states that the flare up is under the right breast again. Informed pt that I would send Dr Maisie Fushomas a message and we will contact her as soon as we receive a response. Pt verbalized understanding.

## 2014-07-08 ENCOUNTER — Encounter (HOSPITAL_COMMUNITY): Payer: Self-pay | Admitting: Emergency Medicine

## 2014-07-08 ENCOUNTER — Emergency Department (HOSPITAL_COMMUNITY)
Admission: EM | Admit: 2014-07-08 | Discharge: 2014-07-08 | Disposition: A | Discharge status: Home / Self Care | Payer: BLUE CROSS/BLUE SHIELD | Attending: Emergency Medicine | Admitting: Emergency Medicine

## 2014-07-08 DIAGNOSIS — Z79899 Other long term (current) drug therapy: Secondary | ICD-10-CM | POA: Diagnosis not present

## 2014-07-08 DIAGNOSIS — Z87891 Personal history of nicotine dependence: Secondary | ICD-10-CM | POA: Diagnosis not present

## 2014-07-08 DIAGNOSIS — Z792 Long term (current) use of antibiotics: Secondary | ICD-10-CM | POA: Diagnosis not present

## 2014-07-08 DIAGNOSIS — N644 Mastodynia: Secondary | ICD-10-CM | POA: Diagnosis not present

## 2014-07-08 DIAGNOSIS — Z880 Allergy status to penicillin: Secondary | ICD-10-CM | POA: Diagnosis not present

## 2014-07-08 DIAGNOSIS — E119 Type 2 diabetes mellitus without complications: Secondary | ICD-10-CM | POA: Insufficient documentation

## 2014-07-08 DIAGNOSIS — L732 Hidradenitis suppurativa: Secondary | ICD-10-CM | POA: Diagnosis present

## 2014-07-08 DIAGNOSIS — Z872 Personal history of diseases of the skin and subcutaneous tissue: Secondary | ICD-10-CM

## 2014-07-08 MED ORDER — CEPHALEXIN 500 MG PO CAPS: 500.0000 mg | ORAL_CAPSULE | Freq: Four times a day (QID) | ORAL | Status: DC

## 2014-07-08 MED ORDER — IBUPROFEN 800 MG PO TABS: 800.0000 mg | ORAL_TABLET | Freq: Three times a day (TID) | ORAL | Status: DC

## 2014-07-08 MED ORDER — HYDROCODONE-ACETAMINOPHEN 5-325 MG PO TABS: 1.0000 | ORAL_TABLET | Freq: Four times a day (QID) | ORAL | Status: DC | PRN

## 2014-07-08 NOTE — Discharge Instructions (Signed)
Call for a follow up appointment with your general surgeon. Return if Symptoms worsen.   Take medication as prescribed.  Apply warm compress 3-4 times a day to the area. Take ibuprofen to reduce symptoms.

## 2014-07-08 NOTE — ED Notes (Signed)
Patient states has been seen by [facility] and she just got a different antibiotic last week from them.  Patient complains of pain under R breast where "there are hard places coming up again and it hurts".  Patient states that surgery advised that they may have to operate to get rid of, but she states she was unable to see them today because they were closed.

## 2014-07-08 NOTE — ED Provider Notes (Signed)
CSN: [id number]     Arrival date & time 07/08/14  1020 History  This chart was scribed for non-physician practitioner, Clabe Seal, PA-C, working with Vida Roller, MD by Charline Bills, ED Scribe. This patient was seen in room TR10C/TR10C and the patient's care was started at 11:23 AM.   Chief Complaint  Patient presents with  . hidradenitis supppurativa    HPI Comments: [name] Gonzales is a 24 y.o. female, with a h/o DM, who presents to the Emergency Department complaining of multiple  gradually worsening discomfort under R breast. She reports associated pain that is exacerbated with pressure. Pt has been seen by Dr. Maisie Fus at [facility] and diagnosed with hidrardenitis suppurativa. Last appointment was November 2015, multiple phone calls without appointment. But prescribed called in bactrim. Pt has taken Doxycycline, Keflex and  currently taking Bactrim Bactrim; last dose was yesterday. The patient stated this is has been a recurrent issue. She has been treating with Tylenol and warm compresses without relief. Patient denies fever, chills. Patient with history of diabetes.  The history is provided by the patient. No language interpreter was used.   Past Medical History  Diagnosis Date  . Diabetes mellitus    Past Surgical History  Procedure Laterality Date  . Salpingoophorectomy    . Oophorectomy  2011   No family history on file. History  Substance Use Topics  . Smoking status: Former Smoker -- 0.25 packs/day    Types: Cigarettes    Quit date: 01/19/2014  . Smokeless tobacco: Not on file  . Alcohol Use: No   OB History    No data available     Review of Systems  Constitutional: Negative for fever and chills.  Skin: Positive for rash.   Allergies  Penicillins  Home Medications   Prior to Admission medications   Medication Sig Start Date End Date Taking? Authorizing Provider  doxycycline (VIBRAMYCIN) 100 MG capsule Take 1 capsule (100 mg total) by mouth daily.  03/01/14  Yes Valarie Merino, MD  esomeprazole (NEXIUM) 20 MG capsule Take 20 mg by mouth daily at 12 noon.   Yes Historical Provider, MD  metFORMIN (GLUCOPHAGE-XR) 500 MG 24 hr tablet Take 1,000 mg by mouth daily.   Yes Historical Provider, MD  norgestrel-ethinyl estradiol (LO/OVRAL,CRYSELLE) 0.3-30 MG-MCG tablet Take 1 tablet by mouth daily.   Yes Historical Provider, MD  phentermine (ADIPEX-P) 37.5 MG tablet Take 37.5 mg by mouth daily.   Yes Historical Provider, MD  sulfamethoxazole-trimethoprim (BACTRIM DS,SEPTRA DS) 800-160 MG per tablet Take 1 tablet by mouth 2 (two) times daily.   Yes Historical Provider, MD  fluconazole (DIFLUCAN) 150 MG tablet Take 1 tablet (150 mg total) by mouth daily. Patient not taking: Reported on 07/08/2014 03/01/14   Valarie Merino, MD  sucralfate (CARAFATE) 1 G tablet Take 1 tablet (1 g total) by mouth 4 (four) times daily -  with meals and at bedtime. Patient not taking: Reported on 07/08/2014 04/23/14   Linwood Dibbles, MD   Triage Vitals: BP 121/83 mmHg  Pulse 90  Temp(Src) 98 F (36.7 C) (Oral)  Resp 18  Ht  (1.727 m)  Wt 287 lb (130.182 kg)  BMI 43.65 kg/m2  SpO2 98%  LMP 06/12/2014 Physical Exam  Constitutional: She is oriented to person, place, and time. She appears well-developed and well-nourished. No distress.  HENT:  Head: Normocephalic and atraumatic.  Eyes: Conjunctivae and EOM are normal.  Neck: Neck supple.  Pulmonary/Chest: Effort normal. No respiratory distress.  Large area of induration under right breast, in the bra line.  Approximately 12cm x 5cm No areas of fluctuance or pointing. No overlying erythema.  Musculoskeletal: Normal range of motion.  Neurological: She is alert and oriented to person, place, and time.  Skin: Skin is warm and dry. She is not diaphoretic.  Psychiatric: She has a normal mood and affect. Her behavior is normal.  Nursing note and vitals reviewed.  ED Course  Procedures (including critical care  time) DIAGNOSTIC STUDIES: Oxygen Saturation is 98% on RA, normal by my interpretation.    COORDINATION OF CARE: 11:29 AM-Discussed treatment plan which includes follow-up with Dr. Maisie Fushomas, keflex, pain control with pt at bedside and pt agreed to plan.   Labs Review Labs Reviewed - No data to display  Imaging Review No results found.   EKG Interpretation None      MDM   Final diagnoses:  History of hidradenitis suppurativa  Breast pain, right   Patient with large indurated area to right bra line, history of hidradenitis suppurativa, diabetes, no obvious fluctuance or abscess. Patient is followed by a general surgeon. No abscess at this time to I&D, [facility] and continue Bactrim, follow-up with general surgeon. Pain medication given. Meds given in ED:  Medications - No data to display  New Prescriptions   CEPHALEXIN (KEFLEX) 500 MG CAPSULE    Take 1 capsule (500 mg total) by mouth 4 (four) times daily.   HYDROCODONE-ACETAMINOPHEN (NORCO/VICODIN) 5-325 MG PER TABLET    Take 1-2 tablets by mouth every 6 (six) hours as needed for moderate pain or severe pain.   IBUPROFEN (ADVIL,MOTRIN) 800 MG TABLET    Take 1 tablet (800 mg total) by mouth 3 (three) times daily.   I personally performed the services described in this documentation, which was scribed in my presence. The recorded information has been reviewed and is accurate.   Mellody DrownLauren Zanayah Shadowens, PA-C 07/08/14 1603  Vida RollerBrian D Miller, MD 07/08/14 2100

## 2014-07-08 NOTE — ED Notes (Signed)
Declined W/C at D/C and was escorted to lobby by RN. 

## 2015-03-17 ENCOUNTER — Emergency Department (HOSPITAL_COMMUNITY)
Admission: EM | Admit: 2015-03-17 | Discharge: 2015-03-17 | Disposition: A | Discharge status: Home / Self Care | Payer: BLUE CROSS/BLUE SHIELD | Attending: Emergency Medicine | Admitting: Emergency Medicine

## 2015-03-17 ENCOUNTER — Emergency Department (HOSPITAL_COMMUNITY): Payer: BLUE CROSS/BLUE SHIELD

## 2015-03-17 ENCOUNTER — Encounter (HOSPITAL_COMMUNITY): Payer: Self-pay | Admitting: Emergency Medicine

## 2015-03-17 DIAGNOSIS — Z8619 Personal history of other infectious and parasitic diseases: Secondary | ICD-10-CM | POA: Diagnosis not present

## 2015-03-17 DIAGNOSIS — Z79899 Other long term (current) drug therapy: Secondary | ICD-10-CM | POA: Diagnosis not present

## 2015-03-17 DIAGNOSIS — E669 Obesity, unspecified: Secondary | ICD-10-CM | POA: Diagnosis not present

## 2015-03-17 DIAGNOSIS — Z8709 Personal history of other diseases of the respiratory system: Secondary | ICD-10-CM | POA: Diagnosis not present

## 2015-03-17 DIAGNOSIS — Z87891 Personal history of nicotine dependence: Secondary | ICD-10-CM | POA: Insufficient documentation

## 2015-03-17 DIAGNOSIS — Z793 Long term (current) use of hormonal contraceptives: Secondary | ICD-10-CM | POA: Diagnosis not present

## 2015-03-17 DIAGNOSIS — Z880 Allergy status to penicillin: Secondary | ICD-10-CM | POA: Insufficient documentation

## 2015-03-17 DIAGNOSIS — R079 Chest pain, unspecified: Secondary | ICD-10-CM | POA: Diagnosis present

## 2015-03-17 DIAGNOSIS — Z872 Personal history of diseases of the skin and subcutaneous tissue: Secondary | ICD-10-CM | POA: Diagnosis not present

## 2015-03-17 DIAGNOSIS — Z8742 Personal history of other diseases of the female genital tract: Secondary | ICD-10-CM | POA: Diagnosis not present

## 2015-03-17 DIAGNOSIS — Z87440 Personal history of urinary (tract) infections: Secondary | ICD-10-CM | POA: Insufficient documentation

## 2015-03-17 DIAGNOSIS — E119 Type 2 diabetes mellitus without complications: Secondary | ICD-10-CM | POA: Diagnosis not present

## 2015-03-17 HISTORY — DX: Gonococcal infection, unspecified: A54.9

## 2015-03-17 HISTORY — DX: Other specified bacterial agents as the cause of diseases classified elsewhere: B96.89

## 2015-03-17 HISTORY — DX: Acute vaginitis: N76.0

## 2015-03-17 HISTORY — DX: Trichomoniasis, unspecified: A59.9

## 2015-03-17 HISTORY — DX: Bronchitis, not specified as acute or chronic: J40

## 2015-03-17 HISTORY — DX: Chlamydial infection, unspecified: A74.9

## 2015-03-17 HISTORY — DX: Paresthesia of skin: R20.2

## 2015-03-17 HISTORY — DX: Hidradenitis suppurativa: L73.2

## 2015-03-17 HISTORY — DX: Urinary tract infection, site not specified: N39.0

## 2015-03-17 LAB — BASIC METABOLIC PANEL
Anion gap: 8 (ref 5–15)
BUN: 7 mg/dL (ref 6–20)
CO2: 24 mmol/L (ref 22–32)
CREATININE: 0.57 mg/dL (ref 0.44–1.00)
Calcium: 8.9 mg/dL (ref 8.9–10.3)
Chloride: 107 mmol/L (ref 101–111)
Glucose, Bld: 104 mg/dL — ABNORMAL HIGH (ref 65–99)
POTASSIUM: 3.8 mmol/L (ref 3.5–5.1)
SODIUM: 139 mmol/L (ref 135–145)

## 2015-03-17 LAB — I-STAT TROPONIN, ED: TROPONIN I, POC: 0 ng/mL (ref 0.00–0.08)

## 2015-03-17 LAB — CBC
HEMATOCRIT: 37.4 % (ref 36.0–46.0)
Hemoglobin: 12.1 g/dL (ref 12.0–15.0)
MCH: 24.7 pg — ABNORMAL LOW (ref 26.0–34.0)
MCHC: 32.4 g/dL (ref 30.0–36.0)
MCV: 76.3 fL — ABNORMAL LOW (ref 78.0–100.0)
PLATELETS: 401 10*3/uL — AB (ref 150–400)
RBC: 4.9 MIL/uL (ref 3.87–5.11)
RDW: 16.4 % — AB (ref 11.5–15.5)
WBC: 8.2 10*3/uL (ref 4.0–10.5)

## 2015-03-17 LAB — D-DIMER, QUANTITATIVE: D-Dimer, Quant: 0.27 ug/mL-FEU (ref 0.00–0.48)

## 2015-03-17 NOTE — ED Notes (Signed)
Patient states she is having central chest pain, intermittent since July, this episode started yesterday and is worsening. Patient states her pain goes into her back and left shoulder. Patient states she has not taken any medication for this pain.

## 2015-03-17 NOTE — ED Notes (Signed)
Bed: [facility] Expected date:  Expected time:  Means of arrival:  Comments: EMS 66F CP from [facility]

## 2015-03-17 NOTE — ED Notes (Signed)
Per EMS, patient coming from urgent care. Patient c/o intermittent chest pain x2 months. Patient with dyspnea on exertion per PA at [facility] facility. Patient with [facility] with NS at [facility] No meds given. EKG done at [facility] as well. Patient states pain is central chest 9/10, with radiation to left shoulder and back pain.

## 2015-03-17 NOTE — Progress Notes (Signed)
[facility] ED CM noted pt with coverage but no pcp listed Spoke with pt who confirms no pcp  [facility] ED CM spoke with pt on how to obtain an in network pcp with insurance coverage via the customer service number or web site  Cm reviewed ED level of care for crisis/emergent services and community pcp level of care to manage continuous or chronic medical concerns.  The pt voiced understanding CM encouraged pt and discussed pt's responsibility to verify with pt's insurance carrier that any recommended medical Kevionna Heffler offered by any emergency room or a hospital Noretta Frier is within the carrier's network. The pt voiced understanding      

## 2015-03-17 NOTE — Discharge Instructions (Signed)

## 2015-03-17 NOTE — ED Notes (Signed)
Bed: [facility]20 Expected date:  Expected time:  Means of arrival:  Comments: 

## 2015-03-17 NOTE — ED Provider Notes (Signed)
CSN: [id number]     Arrival date & time 03/17/15  1005 History   First MD Initiated Contact with Patient 03/17/15 1128     Chief Complaint  Patient presents with  . Chest Pain    HPI She has been having pain off and on since July.  Usually hours at a time.  Over the past week the pain increased so that brought her to the ED.  The pain is in the center of her chest.  It goes to the back and shoulder.  The pain is located on the left.  No fever or cough.  She has felt short of breath. No abdominal pain.  No vomiting or diarrhea.  The pain is worse laying down.  Pt went to an urgent care and they referred her to the ED. Past Medical History  Diagnosis Date  . Diabetes mellitus   . Trichomoniasis   . Bacterial vaginosis   . Gonorrhea   . Chlamydia   . UTI (urinary tract infection)   . Paresthesia     hands, feet  . Hidradenitis suppurativa   . Bronchitis    Past Surgical History  Procedure Laterality Date  . Salpingoophorectomy    . Oophorectomy  2011   No family history on file. Social History  Substance Use Topics  . Smoking status: Former Smoker -- 0.25 packs/day    Types: Cigarettes    Quit date: 01/19/2014  . Smokeless tobacco: None  . Alcohol Use: No   OB History    No data available     Review of Systems  All other systems reviewed and are negative.     Allergies  Penicillins  Home Medications   Prior to Admission medications   Medication Sig Start Date End Date Taking? Authorizing Adya Wirz  esomeprazole (NEXIUM) 20 MG capsule Take 20 mg by mouth daily at 12 noon.   Yes Historical Dalten Ambrosino, MD  metFORMIN (GLUCOPHAGE-XR) 500 MG 24 hr tablet Take 1,000 mg by mouth 2 (two) times daily.    Yes Historical Marlene Pfluger, MD  norgestrel-ethinyl estradiol (LO/OVRAL,CRYSELLE) 0.3-30 MG-MCG tablet Take 1 tablet by mouth daily.   Yes Historical Zeda Gangwer, MD  phentermine (ADIPEX-P) 37.5 MG tablet Take 37.5 mg by mouth daily as needed (appettite).    Yes Historical Jayan Raymundo,  MD  Vitamin D, Ergocalciferol, (DRISDOL) 50000 UNITS CAPS capsule Take 50,000 Units by mouth every 7 (seven) days.   Yes Historical Soham Hollett, MD  doxycycline (VIBRAMYCIN) 100 MG capsule Take 1 capsule (100 mg total) by mouth daily. Patient not taking: Reported on 03/17/2015 03/01/14   Luretha Murphy, MD   BP 137/70 mmHg  Pulse 78  Temp(Src) 98 F (36.7 C) (Oral)  Resp 12  SpO2 99%  LMP 02/24/2015 (Approximate) Physical Exam  Constitutional: No distress.  Obese   HENT:  Head: Normocephalic and atraumatic.  Right Ear: External ear normal.  Left Ear: External ear normal.  Eyes: Conjunctivae are normal. Right eye exhibits no discharge. Left eye exhibits no discharge. No scleral icterus.  Neck: Neck supple. No tracheal deviation present.  Cardiovascular: Normal rate, regular rhythm and intact distal pulses.   Pulmonary/Chest: Effort normal and breath sounds normal. No stridor. No respiratory distress. She has no wheezes. She has no rales. She exhibits tenderness.  Abdominal: Soft. Bowel sounds are normal. She exhibits no distension. There is no tenderness. There is no rebound and no guarding.  Musculoskeletal: She exhibits no edema or tenderness.  Neurological: She is alert. She has normal strength. No cranial  nerve deficit (no facial droop, extraocular movements intact, no slurred speech) or sensory deficit. She exhibits normal muscle tone. She displays no seizure activity. Coordination normal.  Skin: Skin is warm and dry. No rash noted.  Psychiatric: She has a normal mood and affect.  Nursing note and vitals reviewed.   ED Course  Procedures (including critical care time) Labs Review Labs Reviewed  BASIC METABOLIC PANEL - Abnormal; Notable for the following:    Glucose, Bld 104 (*)    All other components within normal limits  CBC - Abnormal; Notable for the following:    MCV 76.3 (*)    MCH 24.7 (*)    RDW 16.4 (*)    Platelets 401 (*)    All other components within normal  limits  D-DIMER, QUANTITATIVE (NOT AT [facility])  Rosezena Sensor, ED    Imaging Review Dg Chest 2 View  03/17/2015   CLINICAL DATA:  One day history of chest pain and shortness of breath  EXAM: CHEST  2 VIEW  COMPARISON:  April 23, 2014  FINDINGS: Degree of inspiration is somewhat shallow. Lungs are clear. Heart size and pulmonary vascularity are normal. No pneumothorax. No adenopathy. No bone lesions.  IMPRESSION: Somewhat shallow degree of inspiration.  No edema or consolidation.   Electronically Signed   By: Bretta Bang III M.D.   On: 03/17/2015 12:20   I have personally reviewed and evaluated these images and lab results as part of my medical decision-making.   EKG Interpretation   Date/Time:  Monday March 17 2015 10:14:18 EDT Ventricular Rate:  84 PR Interval:  141 QRS Duration: 75 QT Interval:  381 QTC Calculation: 450 R Axis:   60 Text Interpretation:  Sinus rhythm Borderline T abnormalities, anterior  leads No significant change since last tracing Confirmed by KNAPP  MD-J,  JON ([id number]) on 03/17/2015 11:14:16 AM      MDM   Final diagnoses:  Chest pain, unspecified chest pain type    Pt has had chest pain for months.  Sx atypical for cardiac disease.  Doubt PE, low risk and negative d dimer.  Labs, EKG and CXR unremarkable.    At this time there does not appear to be any evidence of an acute emergency medical condition and the patient appears stable for discharge with appropriate outpatient follow up.     Linwood Dibbles, MD 03/17/15 1331

## 2015-05-19 ENCOUNTER — Other Ambulatory Visit: Payer: Self-pay | Admitting: Surgery

## 2015-05-19 DIAGNOSIS — L732 Hidradenitis suppurativa: Secondary | ICD-10-CM

## 2015-05-28 ENCOUNTER — Other Ambulatory Visit (HOSPITAL_COMMUNITY): Payer: Self-pay | Admitting: "Endocrinology

## 2015-05-28 ENCOUNTER — Ambulatory Visit
Admission: RE | Admit: 2015-05-28 | Discharge: 2015-05-28 | Disposition: A | Discharge status: Home / Self Care | Payer: BLUE CROSS/BLUE SHIELD | Source: Ambulatory Visit | Attending: Surgery | Admitting: Surgery

## 2015-05-28 DIAGNOSIS — R110 Nausea: Secondary | ICD-10-CM

## 2015-05-28 DIAGNOSIS — L732 Hidradenitis suppurativa: Secondary | ICD-10-CM

## 2015-05-28 MED ORDER — IOPAMIDOL (ISOVUE-300) INJECTION 61%
75.0000 mL | Freq: Once | INTRAVENOUS | Status: AC | PRN
  Administered 2015-05-28: 75 mL via INTRAVENOUS

## 2015-05-29 ENCOUNTER — Other Ambulatory Visit: Payer: BLUE CROSS/BLUE SHIELD

## 2015-06-10 ENCOUNTER — Encounter (HOSPITAL_COMMUNITY)
Admission: RE | Admit: 2015-06-10 | Discharge: 2015-06-10 | Disposition: A | Discharge status: Home / Self Care | Payer: BLUE CROSS/BLUE SHIELD | Source: Ambulatory Visit | Attending: "Endocrinology | Admitting: "Endocrinology

## 2015-06-10 DIAGNOSIS — R110 Nausea: Secondary | ICD-10-CM | POA: Diagnosis present

## 2015-06-10 MED ORDER — STERILE WATER FOR INJECTION IJ SOLN
INTRAMUSCULAR | Status: AC
Start: 2015-06-10 — End: 2015-06-11
  Filled 2015-06-10: qty 10

## 2015-06-10 MED ORDER — SINCALIDE 5 MCG IJ SOLR
INTRAMUSCULAR | Status: AC
  Filled 2015-06-10: qty 5

## 2015-06-10 MED ORDER — STERILE WATER FOR INJECTION IJ SOLN
INTRAMUSCULAR | Status: AC
  Filled 2015-06-10: qty 10

## 2015-06-11 MED ORDER — TECHNETIUM TC 99M MEBROFENIN IV KIT
5.1000 | PACK | Freq: Once | INTRAVENOUS | Status: AC | PRN
  Administered 2015-06-10: 5 via INTRAVENOUS

## 2015-07-09 ENCOUNTER — Telehealth: Payer: Self-pay | Admitting: *Deleted

## 2015-07-09 ENCOUNTER — Ambulatory Visit (INDEPENDENT_AMBULATORY_CARE_PROVIDER_SITE_OTHER): Payer: BLUE CROSS/BLUE SHIELD | Admitting: Infectious Diseases

## 2015-07-09 ENCOUNTER — Encounter: Payer: Self-pay | Admitting: Infectious Diseases

## 2015-07-09 VITALS — BP 128/78 | HR 82 | Temp 98.1°F | Wt 309.0 lb

## 2015-07-09 DIAGNOSIS — Z113 Encounter for screening for infections with a predominantly sexual mode of transmission: Secondary | ICD-10-CM | POA: Diagnosis not present

## 2015-07-09 DIAGNOSIS — L732 Hidradenitis suppurativa: Secondary | ICD-10-CM

## 2015-07-09 MED ORDER — CLINDAMYCIN PHOSPHATE 1 % EX GEL: Freq: Two times a day (BID) | CUTANEOUS | Status: DC

## 2015-07-09 NOTE — Assessment & Plan Note (Signed)
Would suggest she has mild, stage 1 disease. She does not have draining, sinus tracts.  I explained the pathophysiology of this to her.  Will continue her topical clindamycin BID, indefinitely.  Complete her po clinda as written.  Will have her seen by derm to see if she would qualify for immunomodulaitng therapy.  rtc in 1 month

## 2015-07-09 NOTE — Progress Notes (Signed)
   Subjective:    Patient ID: [name] Gonzales, female    DOB: 02-03-1991, 25 y.o.   MRN: [id number]  HPI 25 yo F with hx of obesity, ?DM1 (off meds, pre-diabetic since 2009)  prev cyst on back of her neck as well as lesion under her R breast. She has been seen by surgery previously (01-2014). She was dx with hidradenitis at that time.  She has had ID of lesion on her breast I & D 05-2014, R breast lesion I & D 06-2014, L neck 04-2015.  Has not seen derm in years.  Is currently taking clindamycin  bid since mid December.  Has had temp 102 back in December.   PMHx, Soc, FHx- reviewed and updated in EPIC.   Review of Systems  Constitutional: Positive for fever. Negative for chills, appetite change and unexpected weight change.  Respiratory: Negative for shortness of breath.   Cardiovascular: Negative for chest pain.  Gastrointestinal: Negative for diarrhea and constipation.  Genitourinary: Negative for difficulty urinating.  Neurological: Positive for headaches.   Wears seat belt. Uses condoms.  Had HIV test at [facility], May 2016     Objective:   Physical Exam  Constitutional: She appears well-developed and well-nourished.  HENT:  Mouth/Throat: No oropharyngeal exudate.  Eyes: EOM are normal. Pupils are equal, round, and reactive to light.  Neck: Neck supple.    Cardiovascular: Normal rate, regular rhythm and normal heart sounds.   Pulmonary/Chest: Effort normal and breath sounds normal.    Abdominal: Soft. Bowel sounds are normal. There is no tenderness. There is no rebound.  Musculoskeletal: She exhibits no edema.  Lymphadenopathy:    She has no cervical adenopathy.  Neurological: No sensory deficit.      Assessment & Plan:

## 2015-07-09 NOTE — Assessment & Plan Note (Signed)
Will check oral, rapid test today.

## 2015-07-09 NOTE — Telephone Encounter (Signed)
Called patient & gave appointment information Dermatologist: Feb 20 @ 11:00 AM  Dr Jorja Loa [phone] [address] [id number]

## 2015-07-10 IMAGING — CR DG CHEST 2V
2 series · 2 of 2 positions shown · non-contrast
Comparison: March 06, 2012

CLINICAL DATA: Productive cough and chest pain

EXAM:
CHEST  2 VIEW

[view not recorded (1 of 2)]
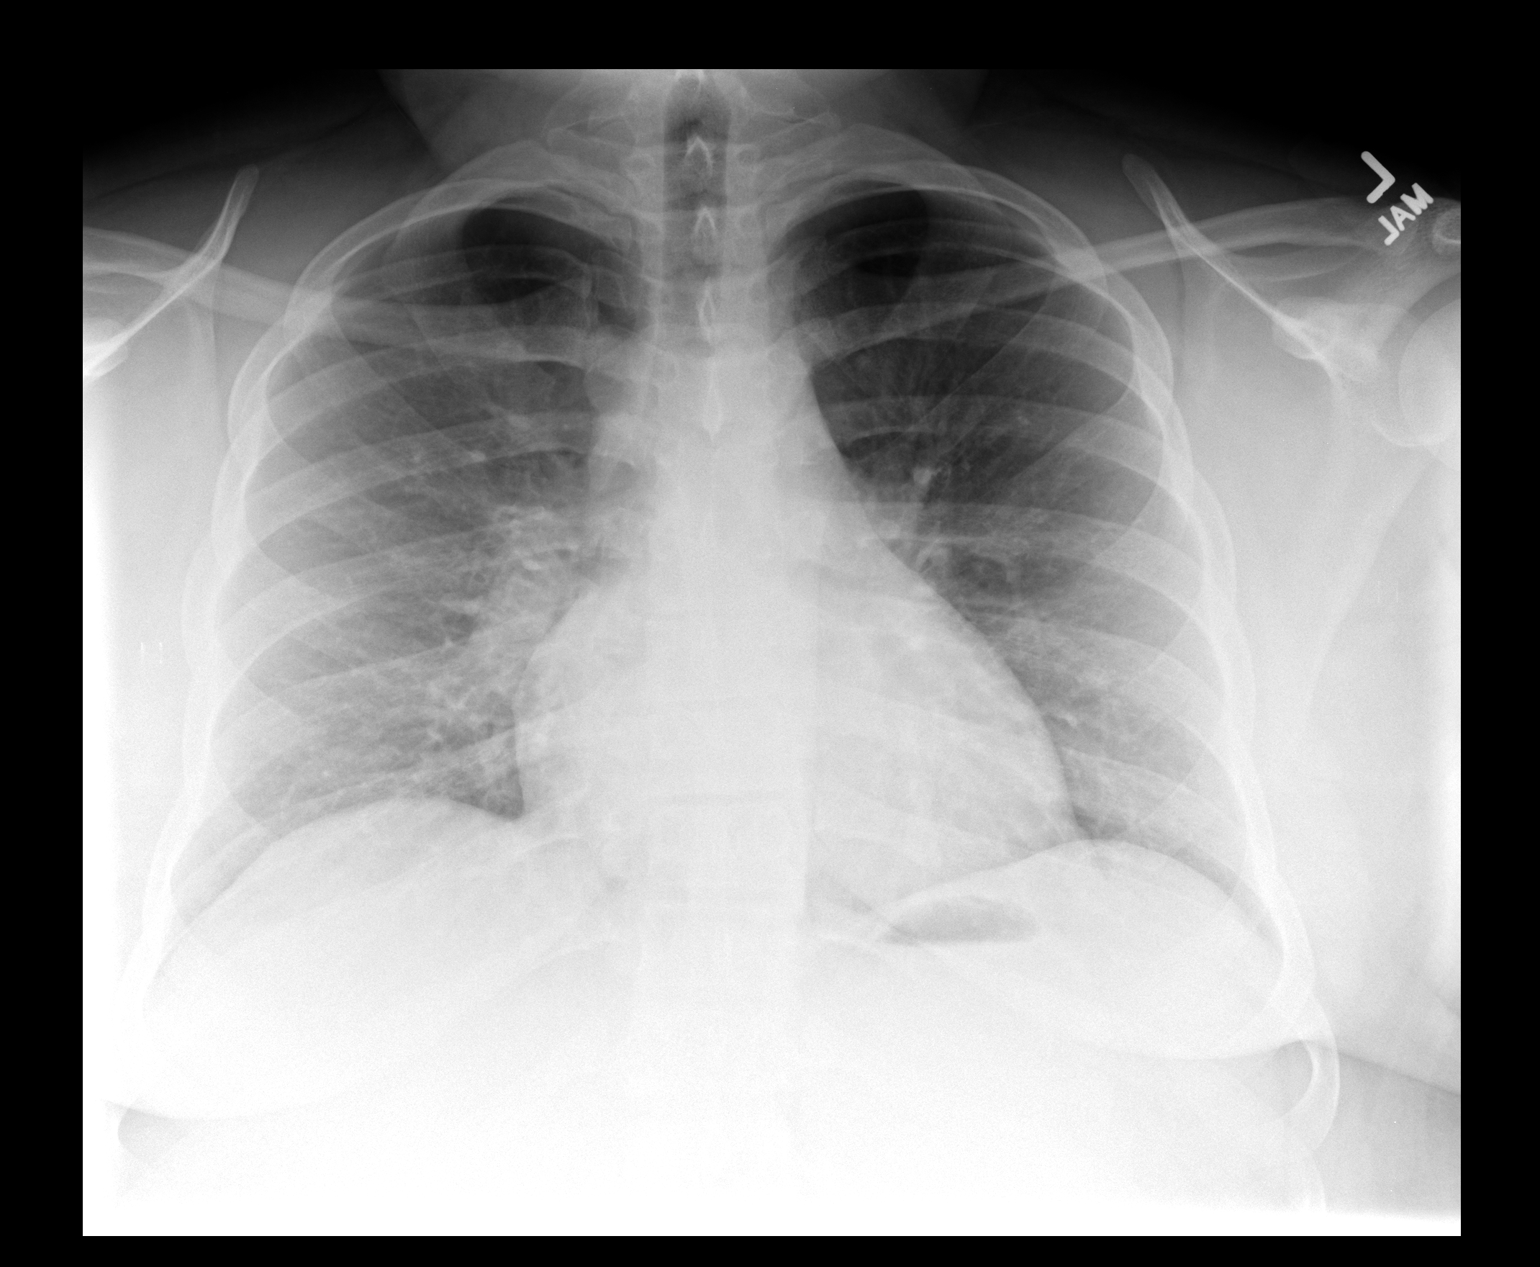

[view not recorded (2 of 2)]
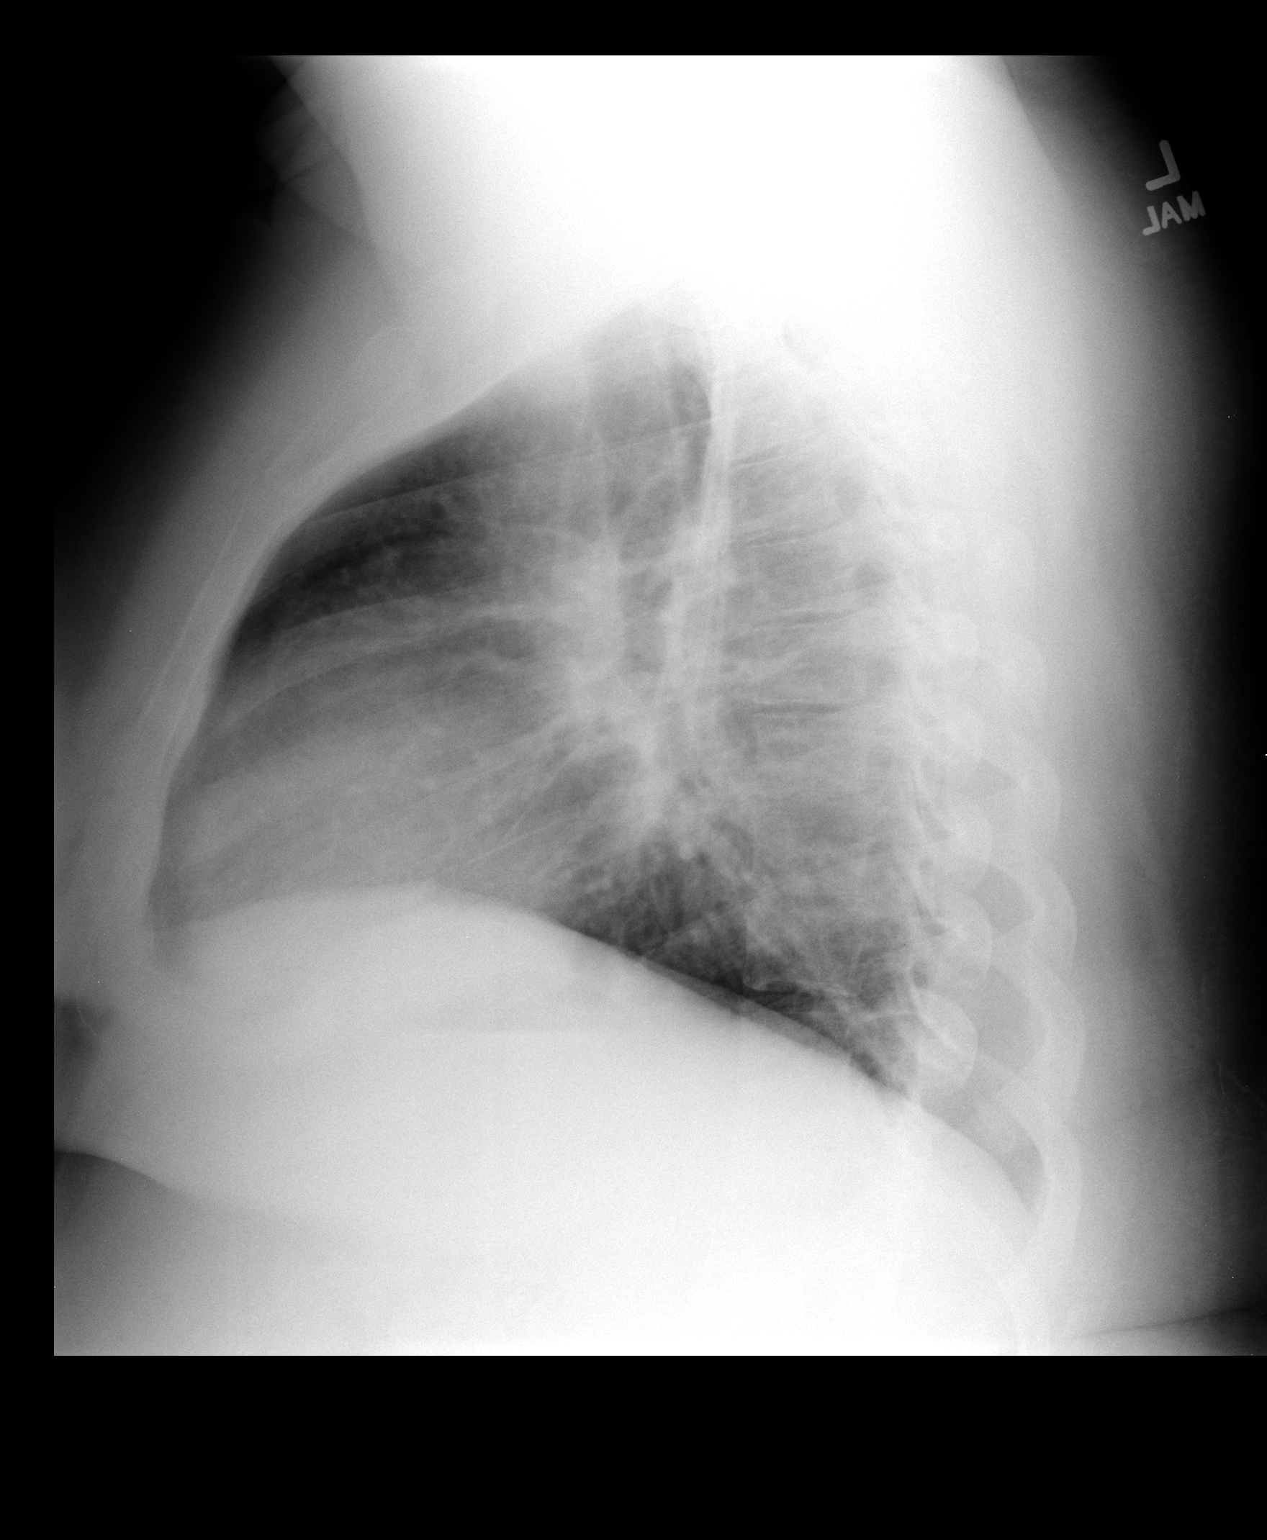

[2 of 2 positions shown; findings below may reference images not displayed]

FINDINGS: There is no edema or consolidation. The heart size and pulmonary
vascularity are within normal limits. No appreciable adenopathy. No
bone lesions.
IMPRESSION: No edema or consolidation.

## 2015-11-04 ENCOUNTER — Ambulatory Visit (HOSPITAL_BASED_OUTPATIENT_CLINIC_OR_DEPARTMENT_OTHER): Payer: BLUE CROSS/BLUE SHIELD | Attending: Surgery | Admitting: Internal Medicine

## 2015-11-04 DIAGNOSIS — G4733 Obstructive sleep apnea (adult) (pediatric): Secondary | ICD-10-CM | POA: Diagnosis not present

## 2015-11-04 DIAGNOSIS — R0902 Hypoxemia: Secondary | ICD-10-CM | POA: Insufficient documentation

## 2015-11-09 NOTE — Procedures (Signed)
    Patient Name: [name] Gonzales, [name] Study Date: 11/04/2015 Gender: Female D.O.B: Jan 10, 1991 Age (years): 24 Referring Provider: Mallory ShirkPaul E Enochs Height (inches): 68 Interpreting Physician: Jetty Duhamellinton Acel Natzke MD, ABSM Weight (lbs): 315 RPSGT: Wylie HailDavis, Rico BMI: 48 MRN: [id number] Neck Size: 20.00 CLINICAL INFORMATION Sleep Study Type: NPSG Indication for sleep study: Obesity Epworth Sleepiness Score: 11  SLEEP STUDY TECHNIQUE As per the AASM Manual for the Scoring of Sleep and Associated Events v2.3 (April 2016) with a hypopnea requiring 4% desaturations. The channels recorded and monitored were frontal, central and occipital EEG, electrooculogram (EOG), submentalis EMG (chin), nasal and oral airflow, thoracic and abdominal wall motion, anterior tibialis EMG, snore microphone, electrocardiogram, and pulse oximetry.  MEDICATIONS Patient's medications include: charted for review Medications self-administered by patient during sleep study : No sleep medicine administered.  SLEEP ARCHITECTURE The study was initiated at 11:03:03 PM and ended at 5:09:13 AM. Sleep onset time was 17.0 minutes and the sleep efficiency was 72.6%. The total sleep time was 265.7 minutes. Stage REM latency was 204.0 minutes. The patient spent 27.66% of the night in stage N1 sleep, 58.72% in stage N2 sleep, 0.75% in stage N3 and 12.87% in REM. Alpha intrusion was absent. Supine sleep was 25.40%. Wake after sleep onset 83 minutes  RESPIRATORY PARAMETERS The overall apnea/hypopnea index (AHI) was 19.0 per hour. There were 51 total apneas, including 51 obstructive, 0 central and 0 mixed apneas. There were 33 hypopneas and 2 RERAs. The AHI during Stage REM sleep was 43.9 per hour. AHI while supine was 7.1 per hour. The mean oxygen saturation was 95.41%. The minimum SpO2 during sleep was 83.00%. Loud snoring was noted during this study.  CARDIAC DATA The 2 lead EKG demonstrated sinus rhythm. The mean heart rate was  84.15 beats per minute. Other EKG findings include: None.  LEG MOVEMENT DATA The total PLMS were 0 with a resulting PLMS index of 0.00. Associated arousal with leg movement index was 0.0 .  IMPRESSIONS - Moderate obstructive sleep apnea occurred during this study (AHI = 19.0/h). - Not enough early events to meet criteria for split CPAP titration protocol - No significant central sleep apnea occurred during this study (CAI = 0.0/h). - Mild oxygen desaturation was noted during this study (Min O2 = 83.00%). - The patient snored with Loud snoring volume. - No cardiac abnormalities were noted during this study. - Clinically significant periodic limb movements did not occur during sleep. No significant associated arousals.  DIAGNOSIS - Obstructive Sleep Apnea (327.23 [G47.33 ICD-10]) - Nocturnal Hypoxemia (327.26 [G47.36 ICD-10])  RECOMMENDATIONS - Therapeutic CPAP titration to determine optimal pressure required to alleviate sleep disordered breathing. - Avoid alcohol, sedatives and other CNS depressants that may worsen sleep apnea and disrupt normal sleep architecture. - Sleep hygiene should be reviewed to assess factors that may improve sleep quality. - Weight management and regular exercise should be initiated or continued if appropriate.  Waymon BudgeYOUNG,Taron Mondor D Diplomate, American Board of Sleep Medicine  ELECTRONICALLY SIGNED ON:  11/09/2015, 2:18 PM [facility] PH: [phone]   FX: [phone] ACCREDITED BY THE AMERICAN ACADEMY OF SLEEP MEDICINE

## 2015-12-05 ENCOUNTER — Ambulatory Visit (HOSPITAL_COMMUNITY): Admission: RE | Admit: 2015-12-05 | Payer: BLUE CROSS/BLUE SHIELD | Source: Ambulatory Visit

## 2015-12-05 ENCOUNTER — Other Ambulatory Visit (HOSPITAL_COMMUNITY): Payer: Self-pay | Admitting: General Surgery

## 2015-12-12 ENCOUNTER — Ambulatory Visit (HOSPITAL_COMMUNITY)
Admission: RE | Admit: 2015-12-12 | Discharge: 2015-12-12 | Disposition: A | Discharge status: Home / Self Care | Payer: BLUE CROSS/BLUE SHIELD | Source: Ambulatory Visit | Attending: General Surgery | Admitting: General Surgery

## 2016-01-09 ENCOUNTER — Encounter: Payer: Self-pay | Admitting: Dietician

## 2016-01-09 ENCOUNTER — Encounter: Payer: BLUE CROSS/BLUE SHIELD | Attending: General Surgery | Admitting: Dietician

## 2016-01-09 NOTE — Progress Notes (Signed)
  Pre-Op Assessment Visit:  Pre-Operative LAGB Surgery  Medical Nutrition Therapy:  Appt start time: 1005   End time:  1040.  Patient was seen on 01/09/2016 for Pre-Operative Nutrition Assessment. Assessment and [facility] on 01/09/2016.   Preferred Learning Style:   No preference indicated   Learning Readiness:   Ready  Handouts given during visit include:  Pre-Op Goals Bariatric Surgery Protein Shakes   During the appointment today the following Pre-Op Goals were reviewed with the patient: Maintain or lose weight as instructed by your surgeon Make healthy food choices Begin to limit portion sizes Limited concentrated sugars and fried foods Keep fat/sugar in the single digits per serving on   food labels Practice CHEWING your food  (aim for 30 chews per bite or until applesauce consistency) Practice not drinking 15 minutes before, during, and 30 minutes after each meal/snack Avoid all carbonated beverages  Avoid/limit caffeinated beverages  Avoid all sugar-sweetened beverages Consume 3 meals per day; eat every 3-5 hours Make a list of non-food related activities Aim for 64-100 ounces of FLUID daily  Aim for at least 60-80 grams of PROTEIN daily Look for a liquid protein source that contain ?15 g protein and ?5 g carbohydrate  (ex: shakes, drinks, shots)   Demonstrated degree of understanding via:  Teach Back  Teaching Method Utilized:  Visual Auditory Hands on  Barriers to learning/adherence to lifestyle change: food preferences  Patient to call the [facility] to enroll in Pre-Op and Post-Op Nutrition Education when surgery date is scheduled.

## 2016-01-19 NOTE — Progress Notes (Signed)
  Pre-Operative Nutrition Class:  Appt start time: 830   End time:  930.  Patient was seen on 01/19/2016 for Pre-Operative Bariatric Surgery Education at the [facility].   Surgery date: 02/03/2016 Surgery type: sleeve gastrectomy Start weight at [facility]: 326 lbs on 01/09/2016 Weight today: 327.4 lbs  TANITA  BODY COMP RESULTS  01/19/16   BMI (kg/m^2) 49.8   Fat Mass (lbs) 180.8   Fat Free Mass (lbs) 146.6   Total Body Water (lbs) 111.4   Samples given per MNT protocol. Patient educated on appropriate usage: Premier protein shake (qty 1- vanilla) Lot #: [id number] Exp: 08/2016  Bariatric Advantage Calcium Citrate chew (caramel - qty 1) Lot #: [id number] Exp: 04/2016  Renee Pain Protein Powder (chocolate - qty 1) Lot #: [id number] Exp: 04/2017  The following the learning objectives were met by the patient during this course:  Identify Pre-Op Dietary Goals and will begin 2 weeks pre-operatively  Identify appropriate sources of fluids and proteins   State protein recommendations and appropriate sources pre and post-operatively  Identify Post-Operative Dietary Goals and will follow for 2 weeks post-operatively  Identify appropriate multivitamin and calcium sources  Describe the need for physical activity post-operatively and will follow MD recommendations  State when to call healthcare provider regarding medication questions or post-operative complications  Handouts given during class include:  Pre-Op Bariatric Surgery Diet Handout  Protein Shake Handout  Post-Op Bariatric Surgery Nutrition Handout  BELT Program Information Flyer  Support Group Information Flyer  [facility] Bariatric Supplements Price List  Follow-Up Plan: Patient will follow-up at [facility] 2 weeks post operatively for diet advancement per MD.

## 2016-01-23 ENCOUNTER — Ambulatory Visit: Payer: Self-pay | Admitting: General Surgery

## 2016-01-29 NOTE — Patient Instructions (Addendum)
[name] Gonzales  01/29/2016   Your procedure is scheduled on: Tuesday 02/03/2016  [facility]  [place] to 3rd floor to  Short Stay Center at  0515  AM.  Call this number if you have problems the morning of surgery [phone]   Remember: ONLY 1 PERSON MAY GO WITH YOU TO SHORT STAY TO GET  READY MORNING OF YOUR SURGERY.   Do not eat food or drink liquids :After Midnight.     Take these medicines the morning of surgery with A SIP OF WATER: none                                 You may not have any metal on your body including hair pins and              piercings  Do not wear jewelry, make-up, lotions, powders or perfumes, deodorant             Do not wear nail polish.  Do not shave  48 hours prior to surgery.              Men may shave face and neck.   Do not bring valuables to the hospital.  IS NOT             RESPONSIBLE   FOR VALUABLES.  Contacts, dentures or bridgework may not be worn into surgery.  Leave suitcase in the car. After surgery it may be brought to your room.                  Please read over the following fact sheets you were given: _____________________________________________________________________             [facility] - Preparing for [facility] Before surgery, you can play an important role.  Because skin is not sterile, your skin needs to be as free of germs as possible.  You can reduce the number of germs on your skin by washing with CHG (chlorahexidine gluconate) soap before surgery.  CHG is an antiseptic cleaner which kills germs and bonds with the skin to continue killing germs even after washing. Please DO NOT use if you have an allergy to CHG or antibacterial soaps.  If your skin becomes reddened/irritated stop using the CHG and inform your nurse when you arrive at Short Stay. Do not shave (including legs and underarms) for at least 48 hours prior to the first CHG shower.  You may  shave your face/neck. Please follow these instructions carefully:  1.  Shower with CHG Soap the night before surgery and the  morning of Surgery.  2.  If you choose to wash your hair, wash your hair first as usual with your  normal  shampoo.  3.  After you shampoo, rinse your hair and body thoroughly to remove the  shampoo.                           4.  Use CHG as you would any other liquid soap.  You can apply chg directly  to the skin and wash                       Gently with a scrungie or clean washcloth.  5.  Apply the CHG Soap to your body ONLY FROM THE NECK DOWN.   Do not use on face/ open                           Wound or open sores. Avoid contact with eyes, ears mouth and genitals (private parts).                       Wash face,  Genitals (private parts) with your normal soap.             6.  Wash thoroughly, paying special attention to the area where your surgery  will be performed.  7.  Thoroughly rinse your body with warm water from the neck down.  8.  DO NOT shower/wash with your normal soap after using and rinsing off  the CHG Soap.                9.  Pat yourself dry with a clean towel.            10.  Wear clean pajamas.            11.  Place clean sheets on your bed the night of your first shower and do not  sleep with pets. Day of Surgery : Do not apply any lotions/deodorants the morning of surgery.  Please wear clean clothes to the hospital/surgery center.  FAILURE TO FOLLOW THESE INSTRUCTIONS MAY RESULT IN THE CANCELLATION OF YOUR SURGERY PATIENT SIGNATURE_________________________________  NURSE SIGNATURE__________________________________  ________________________________________________________________________   Adam Phenix  An incentive spirometer is a tool that can help keep your lungs clear and active. This tool measures how well you are filling your lungs with each breath. Taking long deep breaths may help reverse or decrease the chance of developing  breathing (pulmonary) problems (especially infection) following:  A long period of time when you are unable to move or be active. BEFORE THE PROCEDURE   If the spirometer includes an indicator to show your best effort, your nurse or respiratory therapist will set it to a desired goal.  If possible, sit up straight or lean slightly forward. Try not to slouch.  Hold the incentive spirometer in an upright position. INSTRUCTIONS FOR USE  1. Sit on the edge of your bed if possible, or sit up as far as you can in bed or on a chair. 2. Hold the incentive spirometer in an upright position. 3. Breathe out normally. 4. Place the mouthpiece in your mouth and seal your lips tightly around it. 5. Breathe in slowly and as deeply as possible, raising the piston or the ball toward the top of the column. 6. Hold your breath for 3-5 seconds or for as long as possible. Allow the piston or ball to fall to the bottom of the column. 7. Remove the mouthpiece from your mouth and breathe out normally. 8. Rest for a few seconds and repeat Steps 1 through 7 at least 10 times every 1-2 hours when you are awake. Take your time and take a few normal breaths between deep breaths. 9. The spirometer may include an indicator to show your best effort. Use the indicator as a goal to work toward during each repetition. 10. After each set of 10 deep breaths, practice coughing to be sure your lungs are clear. If you have an incision (the cut made at the time of surgery), support your incision when coughing by placing a pillow or  rolled up towels firmly against it. Once you are able to get out of bed, walk around indoors and cough well. You may stop using the incentive spirometer when instructed by your caregiver.  RISKS AND COMPLICATIONS  Take your time so you do not get dizzy or light-headed.  If you are in pain, you may need to take or ask for pain medication before doing incentive spirometry. It is harder to take a deep  breath if you are having pain. AFTER USE  Rest and breathe slowly and easily.  It can be helpful to keep track of a log of your progress. Your caregiver can provide you with a simple table to help with this. If you are using the spirometer at home, follow these instructions: [place] IF:   You are having difficultly using the spirometer.  You have trouble using the spirometer as often as instructed.  Your pain medication is not giving enough relief while using the spirometer.  You develop fever of 100.5 F (38.1 C) or higher. SEEK IMMEDIATE MEDICAL CARE IF:   You cough up bloody sputum that had not been present before.  You develop fever of 102 F (38.9 C) or greater.  You develop worsening pain at or near the incision site. MAKE SURE YOU:   Understand these instructions.  Will watch your condition.  Will get help right away if you are not doing well or get worse. Document Released: 10/18/2006 Document Revised: 08/30/2011 Document Reviewed: 12/19/2006 [facility] Patient Information 2014 [place], [place].   ________________________________________________________________________

## 2016-01-30 ENCOUNTER — Encounter (HOSPITAL_COMMUNITY): Payer: Self-pay

## 2016-01-30 ENCOUNTER — Encounter (HOSPITAL_COMMUNITY)
Admission: RE | Admit: 2016-01-30 | Discharge: 2016-01-30 | Disposition: A | Discharge status: Home / Self Care | Payer: BLUE CROSS/BLUE SHIELD | Source: Ambulatory Visit | Attending: General Surgery | Admitting: General Surgery

## 2016-01-30 DIAGNOSIS — Z6841 Body Mass Index (BMI) 40.0 and over, adult: Secondary | ICD-10-CM | POA: Insufficient documentation

## 2016-01-30 DIAGNOSIS — Z01812 Encounter for preprocedural laboratory examination: Secondary | ICD-10-CM | POA: Insufficient documentation

## 2016-01-30 DIAGNOSIS — E669 Obesity, unspecified: Secondary | ICD-10-CM | POA: Diagnosis not present

## 2016-01-30 HISTORY — DX: Myoneural disorder, unspecified: G70.9

## 2016-01-30 HISTORY — DX: Gastro-esophageal reflux disease without esophagitis: K21.9

## 2016-01-30 LAB — COMPREHENSIVE METABOLIC PANEL
ALK PHOS: 47 U/L (ref 38–126)
ALT: 33 U/L (ref 14–54)
ANION GAP: 8 (ref 5–15)
AST: 29 U/L (ref 15–41)
Albumin: 4 g/dL (ref 3.5–5.0)
BILIRUBIN TOTAL: 0.1 mg/dL — AB (ref 0.3–1.2)
BUN: 15 mg/dL (ref 6–20)
CALCIUM: 9 mg/dL (ref 8.9–10.3)
CO2: 26 mmol/L (ref 22–32)
CREATININE: 0.67 mg/dL (ref 0.44–1.00)
Chloride: 105 mmol/L (ref 101–111)
GFR calc non Af Amer: >60 mL/min (ref >60)
GLUCOSE: 113 mg/dL — AB (ref 65–99)
Potassium: 3.6 mmol/L (ref 3.5–5.1)
Sodium: 139 mmol/L (ref 135–145)
TOTAL PROTEIN: 7.6 g/dL (ref 6.5–8.1)

## 2016-01-30 LAB — CBC WITH DIFFERENTIAL/PLATELET
Basophils Absolute: 0 10*3/uL (ref 0.0–0.1)
Basophils Relative: 1 %
Eosinophils Absolute: 0.2 10*3/uL (ref 0.0–0.7)
Eosinophils Relative: 3 %
HEMATOCRIT: 37.6 % (ref 36.0–46.0)
HEMOGLOBIN: 12 g/dL (ref 12.0–15.0)
LYMPHS ABS: 2.9 10*3/uL (ref 0.7–4.0)
LYMPHS PCT: 47 %
MCH: 24.4 pg — AB (ref 26.0–34.0)
MCHC: 31.9 g/dL (ref 30.0–36.0)
MCV: 76.6 fL — ABNORMAL LOW (ref 78.0–100.0)
MONOS PCT: 8 %
Monocytes Absolute: 0.5 10*3/uL (ref 0.1–1.0)
NEUTROS ABS: 2.5 10*3/uL (ref 1.7–7.7)
NEUTROS PCT: 41 %
Platelets: 411 10*3/uL — ABNORMAL HIGH (ref 150–400)
RBC: 4.91 MIL/uL (ref 3.87–5.11)
RDW: 15.2 % (ref 11.5–15.5)
WBC: 6.2 10*3/uL (ref 4.0–10.5)

## 2016-01-30 MED FILL — oxyCODONE HCL 5 MG/5ML SOLN: 5 | 5 days supply | Qty: 200 | Fill #0

## 2016-02-02 NOTE — Anesthesia Preprocedure Evaluation (Addendum)
Anesthesia Evaluation  Patient identified by MRN, date of birth, ID band Patient awake    Reviewed: Allergy & Precautions, NPO status , Patient's Chart, lab work & pertinent test results  Airway Mallampati: III  TM Distance: >3 FB Neck ROM: Full    Dental  (+) Teeth Intact, Dental Advisory Given   Pulmonary former smoker,    breath sounds clear to auscultation       Cardiovascular negative cardio ROS   Rhythm:Regular Rate:Normal     Neuro/Psych  Neuromuscular disease negative psych ROS   GI/Hepatic Neg liver ROS, GERD  Medicated,  Endo/Other  diabetes  Renal/GU negative Renal ROS  negative genitourinary   Musculoskeletal negative musculoskeletal ROS (+)   Abdominal (+) + obese,   Peds negative pediatric ROS (+)  Hematology negative hematology ROS (+)   Anesthesia Other Findings   Reproductive/Obstetrics negative OB ROS                            Lab Results  Component Value Date   WBC 6.2 01/30/2016   HGB 12.0 01/30/2016   HCT 37.6 01/30/2016   MCV 76.6 (L) 01/30/2016   PLT 411 (H) 01/30/2016   Lab Results  Component Value Date   CREATININE 0.67 01/30/2016   BUN 15 01/30/2016   NA 139 01/30/2016   K 3.6 01/30/2016   CL 105 01/30/2016   CO2 26 01/30/2016   No results found for: INR, PROTIME  11/2015 EKG: normal sinus rhythm.   Anesthesia Physical Anesthesia Plan  ASA: III  Anesthesia Plan: General   Post-op Pain Management:    Induction: Intravenous  Airway Management Planned: Oral ETT and Video Laryngoscope Planned  Additional Equipment:   Intra-op Plan:   Post-operative Plan: Extubation in OR  Informed Consent: I have reviewed the patients History and Physical, chart, labs and discussed the procedure including the risks, benefits and alternatives for the proposed anesthesia with the patient or authorized representative who has indicated his/her understanding  and acceptance.   Dental advisory given  Plan Discussed with: CRNA  Anesthesia Plan Comments:        Anesthesia Quick Evaluation

## 2016-02-03 ENCOUNTER — Inpatient Hospital Stay (HOSPITAL_COMMUNITY)
Admission: RE | Admit: 2016-02-03 | Discharge: 2016-02-04 | DRG: 621 | Disposition: A | Discharge status: Home / Self Care | Payer: BLUE CROSS/BLUE SHIELD | Source: Ambulatory Visit | Attending: General Surgery | Admitting: General Surgery

## 2016-02-03 ENCOUNTER — Encounter (HOSPITAL_COMMUNITY): Payer: Self-pay | Admitting: *Deleted

## 2016-02-03 ENCOUNTER — Inpatient Hospital Stay (HOSPITAL_COMMUNITY): Payer: BLUE CROSS/BLUE SHIELD | Admitting: Anesthesiology

## 2016-02-03 ENCOUNTER — Encounter (HOSPITAL_COMMUNITY)
Admission: RE | Disposition: A | Discharge status: Home / Self Care | Payer: Self-pay | Source: Ambulatory Visit | Attending: General Surgery

## 2016-02-03 DIAGNOSIS — Z79899 Other long term (current) drug therapy: Secondary | ICD-10-CM

## 2016-02-03 DIAGNOSIS — Z7984 Long term (current) use of oral hypoglycemic drugs: Secondary | ICD-10-CM

## 2016-02-03 DIAGNOSIS — Z880 Allergy status to penicillin: Secondary | ICD-10-CM | POA: Diagnosis not present

## 2016-02-03 DIAGNOSIS — Z6841 Body Mass Index (BMI) 40.0 and over, adult: Secondary | ICD-10-CM | POA: Diagnosis not present

## 2016-02-03 DIAGNOSIS — L732 Hidradenitis suppurativa: Secondary | ICD-10-CM | POA: Diagnosis present

## 2016-02-03 HISTORY — PX: LAPAROSCOPIC GASTRIC SLEEVE RESECTION: SHX5895

## 2016-02-03 LAB — CBC
HEMATOCRIT: 39.6 % (ref 36.0–46.0)
Hemoglobin: 12.7 g/dL (ref 12.0–15.0)
MCH: 24.4 pg — ABNORMAL LOW (ref 26.0–34.0)
MCHC: 32.1 g/dL (ref 30.0–36.0)
MCV: 76.2 fL — ABNORMAL LOW (ref 78.0–100.0)
PLATELETS: 442 10*3/uL — AB (ref 150–400)
RBC: 5.2 MIL/uL — ABNORMAL HIGH (ref 3.87–5.11)
RDW: 15.4 % (ref 11.5–15.5)
WBC: 13.4 10*3/uL — AB (ref 4.0–10.5)

## 2016-02-03 LAB — GLUCOSE, CAPILLARY: GLUCOSE-CAPILLARY: 138 mg/dL — AB (ref 65–99)

## 2016-02-03 LAB — PREGNANCY, URINE: Preg Test, Ur: NEGATIVE

## 2016-02-03 LAB — CREATININE, SERUM: CREATININE: 0.5 mg/dL (ref 0.44–1.00)

## 2016-02-03 SURGERY — GASTRECTOMY, SLEEVE, LAPAROSCOPIC
Anesthesia: General

## 2016-02-03 MED ORDER — ROCURONIUM BROMIDE 100 MG/10ML IV SOLN
INTRAVENOUS | Status: DC | PRN
  Administered 2016-02-03: 60 mg via INTRAVENOUS
  Administered 2016-02-03: 10 mg via INTRAVENOUS

## 2016-02-03 MED ORDER — LACTATED RINGERS IV SOLN
INTRAVENOUS | Status: DC | PRN
  Administered 2016-02-03 (×2): via INTRAVENOUS

## 2016-02-03 MED ORDER — FENTANYL CITRATE (PF) 100 MCG/2ML IJ SOLN
INTRAMUSCULAR | Status: DC | PRN
  Administered 2016-02-03: 100 ug via INTRAVENOUS
  Administered 2016-02-03 (×3): 50 ug via INTRAVENOUS

## 2016-02-03 MED ORDER — LACTATED RINGERS IV SOLN: INTRAVENOUS | Status: DC

## 2016-02-03 MED ORDER — ONDANSETRON HCL 4 MG/2ML IJ SOLN
4.0000 mg | INTRAMUSCULAR | Status: DC | PRN
  Administered 2016-02-03 (×3): 4 mg via INTRAVENOUS
  Filled 2016-02-03 (×3): qty 2

## 2016-02-03 MED ORDER — MEPERIDINE HCL 50 MG/ML IJ SOLN: 6.2500 mg | INTRAMUSCULAR | Status: DC | PRN

## 2016-02-03 MED ORDER — CLINDAMYCIN PHOSPHATE 900 MG/50ML IV SOLN
900.0000 mg | Freq: Once | INTRAVENOUS | Status: AC
  Administered 2016-02-03: 900 mg via INTRAVENOUS

## 2016-02-03 MED ORDER — EPHEDRINE SULFATE 50 MG/ML IJ SOLN
INTRAMUSCULAR | Status: DC | PRN
  Administered 2016-02-03: 10 mg via INTRAVENOUS

## 2016-02-03 MED ORDER — LACTATED RINGERS IR SOLN
Status: DC | PRN
  Administered 2016-02-03: 1000 mL

## 2016-02-03 MED ORDER — PROPOFOL 10 MG/ML IV BOLUS
INTRAVENOUS | Status: DC | PRN
  Administered 2016-02-03: 200 mg via INTRAVENOUS

## 2016-02-03 MED ORDER — PROMETHAZINE HCL 25 MG/ML IJ SOLN: 6.2500 mg | INTRAMUSCULAR | Status: DC | PRN

## 2016-02-03 MED ORDER — CLINDAMYCIN PHOSPHATE 900 MG/50ML IV SOLN
INTRAVENOUS | Status: AC
  Filled 2016-02-03: qty 50

## 2016-02-03 MED ORDER — FENTANYL CITRATE (PF) 250 MCG/5ML IJ SOLN
INTRAMUSCULAR | Status: AC
Start: 2016-02-03 — End: 2016-02-03
  Filled 2016-02-03: qty 5

## 2016-02-03 MED ORDER — SODIUM CHLORIDE 0.9 % IJ SOLN
INTRAMUSCULAR | Status: AC
  Filled 2016-02-03: qty 50

## 2016-02-03 MED ORDER — 0.9 % SODIUM CHLORIDE (POUR BTL) OPTIME
TOPICAL | Status: DC | PRN
  Administered 2016-02-03: 1000 mL

## 2016-02-03 MED ORDER — MIDAZOLAM HCL 2 MG/2ML IJ SOLN
INTRAMUSCULAR | Status: AC
  Filled 2016-02-03: qty 2

## 2016-02-03 MED ORDER — ROCURONIUM BROMIDE 100 MG/10ML IV SOLN
INTRAVENOUS | Status: AC
  Filled 2016-02-03: qty 1

## 2016-02-03 MED ORDER — HEPARIN SODIUM (PORCINE) 5000 UNIT/ML IJ SOLN
5000.0000 [IU] | INTRAMUSCULAR | Status: AC
  Administered 2016-02-03: 5000 [IU] via SUBCUTANEOUS
  Filled 2016-02-03: qty 1

## 2016-02-03 MED ORDER — MIDAZOLAM HCL 5 MG/5ML IJ SOLN
INTRAMUSCULAR | Status: DC | PRN
  Administered 2016-02-03: 1 mg via INTRAVENOUS

## 2016-02-03 MED ORDER — SUCCINYLCHOLINE CHLORIDE 20 MG/ML IJ SOLN
INTRAMUSCULAR | Status: DC | PRN
  Administered 2016-02-03: 160 mg via INTRAVENOUS

## 2016-02-03 MED ORDER — ACETAMINOPHEN 160 MG/5ML PO SOLN: 325.0000 mg | ORAL | Status: DC | PRN

## 2016-02-03 MED ORDER — SUGAMMADEX SODIUM 200 MG/2ML IV SOLN
INTRAVENOUS | Status: AC
  Filled 2016-02-03: qty 2

## 2016-02-03 MED ORDER — ONDANSETRON HCL 4 MG/2ML IJ SOLN
INTRAMUSCULAR | Status: AC
  Filled 2016-02-03: qty 2

## 2016-02-03 MED ORDER — CHLORHEXIDINE GLUCONATE CLOTH 2 % EX PADS: 6.0000 | MEDICATED_PAD | Freq: Once | CUTANEOUS | Status: DC

## 2016-02-03 MED ORDER — LIDOCAINE HCL (CARDIAC) 20 MG/ML IV SOLN
INTRAVENOUS | Status: AC
  Filled 2016-02-03: qty 5

## 2016-02-03 MED ORDER — PHENYLEPHRINE HCL 10 MG/ML IJ SOLN
INTRAMUSCULAR | Status: DC | PRN
  Administered 2016-02-03 (×2): 40 ug via INTRAVENOUS
  Administered 2016-02-03: 80 ug via INTRAVENOUS

## 2016-02-03 MED ORDER — OXYCODONE HCL 5 MG/5ML PO SOLN
5.0000 mg | ORAL | Status: DC | PRN
  Administered 2016-02-04: 5 mg via ORAL
  Filled 2016-02-03: qty 5

## 2016-02-03 MED ORDER — ACETAMINOPHEN 160 MG/5ML PO SOLN: 650.0000 mg | ORAL | Status: DC | PRN

## 2016-02-03 MED ORDER — DEXAMETHASONE SODIUM PHOSPHATE 10 MG/ML IJ SOLN
INTRAMUSCULAR | Status: DC | PRN
  Administered 2016-02-03: 10 mg via INTRAVENOUS

## 2016-02-03 MED ORDER — ONDANSETRON HCL 4 MG/2ML IJ SOLN
INTRAMUSCULAR | Status: DC | PRN
  Administered 2016-02-03: 4 mg via INTRAVENOUS

## 2016-02-03 MED ORDER — APREPITANT 40 MG PO CAPS
40.0000 mg | ORAL_CAPSULE | ORAL | Status: AC
  Administered 2016-02-03: 40 mg via ORAL

## 2016-02-03 MED ORDER — SODIUM CHLORIDE 0.9 % IV SOLN
INTRAVENOUS | Status: DC
  Administered 2016-02-03: 22:00:00 via INTRAVENOUS
  Administered 2016-02-03: 100 mL/h via INTRAVENOUS
  Administered 2016-02-04: 07:00:00 via INTRAVENOUS

## 2016-02-03 MED ORDER — ENOXAPARIN SODIUM 30 MG/0.3ML ~~LOC~~ SOLN
30.0000 mg | Freq: Two times a day (BID) | SUBCUTANEOUS | Status: DC
  Administered 2016-02-04: 30 mg via SUBCUTANEOUS

## 2016-02-03 MED ORDER — SUGAMMADEX SODIUM 500 MG/5ML IV SOLN
INTRAVENOUS | Status: DC | PRN
  Administered 2016-02-03: 300 mg via INTRAVENOUS

## 2016-02-03 MED ORDER — ACETAMINOPHEN 10 MG/ML IV SOLN
1000.0000 mg | Freq: Four times a day (QID) | INTRAVENOUS | Status: AC
  Administered 2016-02-03 – 2016-02-04 (×4): 1000 mg via INTRAVENOUS
  Filled 2016-02-03 (×4): qty 100

## 2016-02-03 MED ORDER — BUPIVACAINE LIPOSOME 1.3 % IJ SUSP
20.0000 mL | Freq: Once | INTRAMUSCULAR | Status: AC
  Administered 2016-02-03: 20 mL
  Filled 2016-02-03: qty 20

## 2016-02-03 MED ORDER — PHENYLEPHRINE 40 MCG/ML (10ML) SYRINGE FOR IV PUSH (FOR BLOOD PRESSURE SUPPORT)
PREFILLED_SYRINGE | INTRAVENOUS | Status: AC
  Filled 2016-02-03: qty 10

## 2016-02-03 MED ORDER — FENTANYL CITRATE (PF) 100 MCG/2ML IJ SOLN: 25.0000 ug | INTRAMUSCULAR | Status: DC | PRN

## 2016-02-03 MED ORDER — PREMIER PROTEIN SHAKE: 2.0000 [oz_av] | ORAL | Status: DC

## 2016-02-03 MED ORDER — DEXAMETHASONE SODIUM PHOSPHATE 10 MG/ML IJ SOLN
INTRAMUSCULAR | Status: AC
  Filled 2016-02-03: qty 1

## 2016-02-03 MED ORDER — PROPOFOL 10 MG/ML IV BOLUS
INTRAVENOUS | Status: AC
  Filled 2016-02-03: qty 40

## 2016-02-03 MED ORDER — MORPHINE SULFATE (PF) 2 MG/ML IV SOLN
2.0000 mg | INTRAVENOUS | Status: DC | PRN
  Administered 2016-02-03 (×3): 2 mg via INTRAVENOUS
  Administered 2016-02-03 – 2016-02-04 (×2): 4 mg via INTRAVENOUS
  Filled 2016-02-03: qty 1
  Filled 2016-02-03: qty 2
  Filled 2016-02-03 (×2): qty 1
  Filled 2016-02-03: qty 2

## 2016-02-03 MED ORDER — METOPROLOL TARTRATE 5 MG/5ML IV SOLN: 5.0000 mg | Freq: Four times a day (QID) | INTRAVENOUS | Status: DC | PRN

## 2016-02-03 MED ORDER — LIDOCAINE HCL (CARDIAC) 20 MG/ML IV SOLN
INTRAVENOUS | Status: DC | PRN
  Administered 2016-02-03: 100 mg via INTRAVENOUS

## 2016-02-03 SURGICAL SUPPLY — 58 items
APPLIER CLIP 5 13 M/L LIGAMAX5 (MISCELLANEOUS)
APPLIER CLIP ROT 10 11.4 M/L (STAPLE)
APPLIER CLIP ROT 13.4 12 LRG (CLIP)
BLADE SURG SZ11 CARB STEEL (BLADE) ×3 IMPLANT
CABLE HIGH FREQUENCY MONO STRZ (ELECTRODE) ×3 IMPLANT
CHLORAPREP W/TINT 26ML (MISCELLANEOUS) ×6 IMPLANT
CLIP APPLIE 5 13 M/L LIGAMAX5 (MISCELLANEOUS) IMPLANT
CLIP APPLIE ROT 10 11.4 M/L (STAPLE) IMPLANT
CLIP APPLIE ROT 13.4 12 LRG (CLIP) IMPLANT
COVER SURGICAL LIGHT HANDLE (MISCELLANEOUS) IMPLANT
DRAIN CHANNEL 19F RND (DRAIN) IMPLANT
ELECT REM PT RETURN 9FT ADLT (ELECTROSURGICAL) ×3
ELECTRODE REM PT RTRN 9FT ADLT (ELECTROSURGICAL) ×1 IMPLANT
EVACUATOR SILICONE 100CC (DRAIN) IMPLANT
GAUZE SPONGE 4X4 12PLY STRL (GAUZE/BANDAGES/DRESSINGS) IMPLANT
GLOVE BIOGEL PI IND STRL 7.0 (GLOVE) ×1 IMPLANT
GLOVE BIOGEL PI INDICATOR 7.0 (GLOVE) ×2
GLOVE SURG SS PI 7.0 STRL IVOR (GLOVE) ×6 IMPLANT
GOWN STRL REUS W/TWL LRG LVL3 (GOWN DISPOSABLE) ×3 IMPLANT
GOWN STRL REUS W/TWL XL LVL3 (GOWN DISPOSABLE) ×9 IMPLANT
GRASPER SUT TROCAR 14GX15 (MISCELLANEOUS) ×3 IMPLANT
HANDLE STAPLE EGIA 4 XL (STAPLE) ×3 IMPLANT
HOVERMATT SINGLE USE (MISCELLANEOUS) ×3 IMPLANT
IRRIG SUCT STRYKERFLOW 2 WTIP (MISCELLANEOUS) ×3
IRRIGATION SUCT STRKRFLW 2 WTP (MISCELLANEOUS) ×1 IMPLANT
KIT BASIN OR (CUSTOM PROCEDURE TRAY) ×3 IMPLANT
LIQUID BAND (GAUZE/BANDAGES/DRESSINGS) ×3 IMPLANT
MARKER SKIN DUAL TIP RULER LAB (MISCELLANEOUS) ×3 IMPLANT
NEEDLE SPNL 22GX3.5 QUINCKE BK (NEEDLE) ×3 IMPLANT
PACK CARDIOVASCULAR III (CUSTOM PROCEDURE TRAY) ×3 IMPLANT
POUCH SPECIMEN RETRIEVAL 10MM (ENDOMECHANICALS) ×3 IMPLANT
RELOAD STAPLER 60MM BLK (STAPLE) IMPLANT
RELOAD TRI 45 ART MED THCK BLK (STAPLE) IMPLANT
RELOAD TRI 45 ART MED THCK PUR (STAPLE) IMPLANT
RELOAD TRI 60 ART MED THCK BLK (STAPLE) ×6 IMPLANT
RELOAD TRI 60 ART MED THCK PUR (STAPLE) ×9 IMPLANT
SCISSORS LAP 5X45 EPIX DISP (ENDOMECHANICALS) ×3 IMPLANT
SHEARS HARMONIC ACE PLUS 45CM (MISCELLANEOUS) ×3 IMPLANT
SLEEVE GASTRECTOMY 40FR VISIGI (MISCELLANEOUS) ×3 IMPLANT
SLEEVE XCEL OPT CAN 5 100 (ENDOMECHANICALS) ×9 IMPLANT
SOLUTION ANTI FOG 6CC (MISCELLANEOUS) ×3 IMPLANT
SPONGE LAP 18X18 X RAY DECT (DISPOSABLE) ×3 IMPLANT
STAPLER RELOAD 60MM BLK (STAPLE)
SUT ETHIBOND 0 36 GRN (SUTURE) IMPLANT
SUT ETHILON 2 0 PS N (SUTURE) IMPLANT
SUT MNCRL AB 4-0 PS2 18 (SUTURE) ×3 IMPLANT
SUT SILK 0 SH 30 (SUTURE) ×3 IMPLANT
SUT VICRYL 0 TIES 12 18 (SUTURE) ×3 IMPLANT
SYR 20CC LL (SYRINGE) ×3 IMPLANT
SYR 50ML LL SCALE MARK (SYRINGE) ×3 IMPLANT
TOWEL OR 17X26 10 PK STRL BLUE (TOWEL DISPOSABLE) ×3 IMPLANT
TOWEL OR NON WOVEN STRL DISP B (DISPOSABLE) ×3 IMPLANT
TROCAR BLADELESS 15MM (ENDOMECHANICALS) ×3 IMPLANT
TROCAR BLADELESS OPT 5 100 (ENDOMECHANICALS) ×3 IMPLANT
TUBING CONNECTING 10 (TUBING) ×2 IMPLANT
TUBING CONNECTING 10' (TUBING) ×1
TUBING ENDO SMARTCAP (MISCELLANEOUS) ×3 IMPLANT
TUBING INSUF HEATED (TUBING) ×3 IMPLANT

## 2016-02-03 NOTE — Op Note (Signed)
Preop Diagnosis: Obesity Class III  Postop Diagnosis: same  Procedure performed: laparoscopic Sleeve Gastrectomy  Assitant: Ovidio Kinavid Newman  Indications:  The patient is a 25 y.o. year-old morbidly obese female who has been followed in the Bariatric Clinic as an outpatient. This patient was diagnosed with morbid obesity with a BMI of Body mass index is 49.11 kg/m. and significant co-morbidities including hypertension and non-insulin dependent diabetes.  The patient was counseled extensively in the Bariatric Outpatient Clinic and after a thorough explanation of the risks and benefits of surgery (including death from complications, bowel leak, infection such as peritonitis and/or sepsis, internal hernia, bleeding, need for blood transfusion, bowel obstruction, organ failure, pulmonary embolus, deep venous thrombosis, wound infection, incisional hernia, skin breakdown, and others entailed on the consent form) and after a compliant diet and exercise program, the patient was scheduled for an elective laparoscopic sleeve gastrectomy.  Description of Operation:  Following informed consent, the patient was taken to the operating room and placed on the operating table in the supine position.  She had previously received prophylactic antibiotics and subcutaneous heparin for DVT prophylaxis in the pre-op holding area.  After induction of general endotracheal anesthesia by the anesthesiologist, the patient underwent placement of sequential compression devices, Foley catheter and an oro-gastric tube.  A timeout was confirmed by the surgery and anesthesia teams.  The patient was adequately padded at all pressure points and placed on a footboard to prevent slippage from the OR table during extremes of position during surgery.  She underwent a routine sterile prep and drape of her entire abdomen.    Next, A transverse incision was made under the left subcostal area and a 5mm optical viewing trocar was introduced into  the peritoneal cavity. Pneumoperitoneum was applied with a high flow and low pressure. A laparoscope was inserted to confirm placement. A extraperitoneal block was then placed at the lateral abdominal wall using exparel . 5 additional trocars were placed: 1 5mm trocar to the left of the midline. 1 additional 5mm trocar in the left lateral area, 1 12mm trocar in the right mid abdomen, and 1 5mm trocar in the right subcostal area.  The fat pad at the GE junction was incised and the gastrodiaphragmatic ligament was divided using the Harmonic scalpel. Next, a hole was created through the lesser omentum along the greater curve of the stomach to enter the lesser sac. The vessels along the greater omentum were  Then ligated and divided using the Harmonic scalpel moving towards the spleen and then short gastric vessels were ligated and divided in the same fashion to fully mobilize the fundus. The left crus was identified to ensure completion of the dissection. Next the antrum was measured and dissection continued inferiorly along the greater curve towards the pylorus and stopped 6cm from the pylorus.   A 40Fr ViSiGi dilator was placed into the esophgaus and along the lesser curve of the stomach and placed on suction. A 60mm 4-445mm tristapler was used to begin the resection along the antrum being sure to stay well away from the angularis by angling the jaws of the stapler towards the greater curve. An additional 60mm 4-755mm tristapler was used to continue the dissection. Then multiple 60mm 3-664mm tristapler loads were used to complete the resection staying along the ViSiGi and ensuring the fundus was not retained by appropriately retracting it lateral. Air was inserted through the ViSiGi to perform a leak test showing no bubbles and a neutral lie of the stomach.  The assistant  then went and performed an upper endoscopy and leak test. No bubbles were seen and the sleeve and antrum distended appropriately. The specimen was  then placed in an endocatch bag and removed by the 15mm port. The fascia of the 15mm port was closed with a 0 vicryl by suture passer. Hemostasis was ensured. Pneumoperitoneum was evacuated, all ports were removed and all incisions closed with 4-0 monocryl suture in subcuticular fashion. Glue was put in place for dressing. The patient awoke from anesthesia and was brought to pacu in stable condition. All counts were correct.  Specimens:  Sleeve gastrectomy  Post-Op Plan:       Pain Management: PO, prn      Antibiotics: Prophylactic      Anticoagulation: Prophylactic, Starting now      Post Op Studies/Consults: Not applicable      Intended Discharge: within 48h      Intended Outpatient Follow-Up: Two Week      Intended Outpatient Studies: Not Applicable      Other: Not Applicable   De BlanchLuke Aaron Anushri Casalino

## 2016-02-03 NOTE — Discharge Instructions (Addendum)
Aprepitant Discharge Instructions  °On the day of surgery you were given the medication aprepitant. This medication interacts with hormonal forms of birth control (oral contraceptives and injected or implanted birth control) and may make them ineffective. °IF YOU USE ANY HORMONAL FORM OF BIRTH CONTROL, YOU MUST USE AN ADDITIONAL BARRIER BIRTH CONTROL METHOD FOR ONE MONTH after receiving aprepitant or there is a chance you could become pregnant. ° ° ° ° °GASTRIC BYPASS/SLEEVE ° Home Care Instructions ° ° These instructions are to help you care for yourself when you go home. ° °Call: If you have any problems. °• Call [phone] and ask for the surgeon on call °• If you need immediate assistance come to the ER at [facility]. Tell the ER staff you are a new post-op gastric bypass or gastric sleeve patient  °Signs and symptoms to report: • Severe  vomiting or nausea °o If you cannot handle clear liquids for longer than 1 day, call your surgeon °• Abdominal pain which does not get better after taking your pain medication °• Fever greater than 100.4°  F and chills °• Heart rate over 100 beats a minute °• Trouble breathing °• Chest pain °• Redness,  swelling, drainage, or foul odor at incision (surgical) sites °• If your incisions open or pull apart °• Swelling or pain in calf (lower leg) °• Diarrhea (Loose bowel movements that happen often), frequent watery, uncontrolled bowel movements °• Constipation, (no bowel movements for 3 days) if this happens: °o Take Milk of Magnesia, 2 tablespoons by mouth, 3 times a day for 2 days if needed °o Stop taking Milk of Magnesia once you have had a bowel movement °o Call your doctor if constipation continues °Or °o Take Miralax  (instead of Milk of Magnesia) following the label instructions °o Stop taking Miralax once you have had a bowel movement °o Call your doctor if constipation continues °• Anything you think is “abnormal for you” °  °Normal side effects after surgery: • Unable  to sleep at night or unable to concentrate °• Irritability °• Being tearful (crying) or depressed ° °These are common complaints, possibly related to your anesthesia, stress of surgery, and change in lifestyle, that usually go away a few weeks after surgery. If these feelings continue, call your medical doctor.  °Wound Care: You may have surgical glue, steri-strips, or staples over your incisions after surgery °• Surgical glue: Looks like clear film over your incisions and will wear off a little at a time °• Steri-strips: Adhesive strips of tape over your incisions. You may notice a yellowish color on skin under the steri-strips. This is used to make the steri-strips stick better. Do not pull the steri-strips off - let them fall off °• Staples: Staples may be removed before you leave the hospital °o If you go home with staples, call [facility] for an appointment with your surgeon’s nurse to have staples removed 10 days after surgery, [phone] °• Showering: You may shower two (2) days after your surgery unless your surgeon tells you differently °o Wash gently around incisions with warm soapy water, rinse well, and gently pat dry °o If you have a drain (tube from your incision), you may need someone to hold this while you shower °o No tub baths until staples are removed and incisions are healed °  °Medications: • Medications should be liquid or crushed if larger than the size of a dime °• Extended release pills (medication that releases a little bit at   a time through the  day) should not be crushed °• Depending on the size and number of medications you take, you may need to space (take a few throughout the day)/change the time you take your medications so that you do not over-fill your pouch (smaller stomach) °• Make sure you follow-up with you primary care physician to make medication changes needed during rapid weight loss and life -style changes °• If you have diabetes, follow up with your  doctor that orders your diabetes medication(s) within one week after surgery and check your blood sugar regularly ° °• Do not drive while taking narcotics (pain medications) ° °• Do not take acetaminophen (Tylenol) and Roxicet or Lortab Elixir at the same time since these pain medications contain acetaminophen °  °Diet:  °First 2 Weeks You will see the nutritionist about two (2) weeks after your surgery. The nutritionist will increase the types of foods you can eat if you are handling liquids well: °• If you have severe vomiting or nausea and cannot handle clear liquids lasting longer than 1 day call your surgeon °Protein Shake °• Drink at least 2 ounces of shake 5-6 times per day °• Each serving of protein shakes (usually 8-12 ounces) should have a minimum of: °o 15 grams of protein °o And no more than 5 grams of carbohydrate °• Goal for protein each day: °o Men = 80 grams per day °o Women = 60 grams per day °  ° • Protein powder may be added to fluids such as non-fat milk or Lactaid milk or Soy milk (limit to 35 grams added protein powder per serving) ° °Hydration °• Slowly increase the amount of water and other clear liquids as tolerated (See Acceptable Fluids) °• Slowly increase the amount of protein shake as tolerated °• Sip fluids slowly and throughout the day °• May use sugar substitutes in small amounts (no more than 6-8 packets per day; i.e. Splenda) ° °Fluid Goal °• The first goal is to drink at least 8 ounces of protein shake/drink per day (or as directed by the nutritionist); some examples of protein shakes are Syntrax Nectar, Adkins Advantage, EAS Edge HP, and Unjury. - See handout from pre-op Bariatric Education Class: °o Slowly increase the amount of protein shake you drink as tolerated °o You may find it easier to slowly sip shakes throughout the day °o It is important to get your proteins in first °• Your fluid goal is to drink 64-100 ounces of fluid daily °o It may take a few weeks to build up to  this  °• 32 oz. (or more) should be clear liquids °And °• 32 oz. (or more) should be full liquids (see below for examples) °• Liquids should not contain sugar, caffeine, or carbonation ° °Clear Liquids: °• Water of Sugar-free flavored water (i.e. Fruit H²O, Propel) °• Decaffeinated coffee or tea (sugar-free) °• Crystal lite, Wyler’s Lite, Minute Maid Lite °• Sugar-free Jell-O °• Bouillon or broth °• Sugar-free Popsicle:    - Less than 20 calories each; Limit 1 per day ° °Full Liquids: °                  Protein Shakes/Drinks + 2 choices per day of other full liquids °• Full liquids must be: °o No More Than 12 grams of Carbs per serving °o No More Than 3 grams of Fat per serving °• Strained low-fat cream soup °• Non-Fat milk °• Fat-free Lactaid Milk °• Sugar-free yogurt (Dannon Lite & Fit, Greek   yogurt) ° °  °Vitamins and Minerals • Start 1 day after surgery unless otherwise directed by your surgeon °• 2 Chewable Multivitamin / Multimineral Supplement with iron (i.e. Centrum for Adults) °• Vitamin B-12, 350-500 micrograms sub-lingual (place tablet under the tongue) each day °• Chewable Calcium Citrate with Vitamin D-3 °(Example: 3 Chewable Calcium  Plus 600 with Vitamin D-3) °o Take 500 mg three (3) times a day for a total of 1500 mg each day °o Do not take all 3 doses of calcium at one time as it may cause constipation, and you can only absorb 500 mg at a time °o Do not mix multivitamins containing iron with calcium supplements;  take 2 hours apart °o Do not substitute Tums (calcium carbonate) for your calcium °• Menstruating women and those at risk for anemia ( a blood disease that causes weakness) may need extra iron °o Talk to your doctor to see if you need more iron °• If you need extra iron: Total daily Iron recommendation (including Vitamins) is 50 to 100 mg Iron/day °• Do not stop taking or change any vitamins or minerals until you talk to your nutritionist or surgeon °• Your nutritionist and/or surgeon must  approve all vitamin and mineral supplements °  °Activity and Exercise: It is important to continue walking at home. Limit your physical activity as instructed by your doctor. During this time, use these guidelines: °• Do not lift anything greater than ten  (10) pounds for at least two (2) weeks °• Do not go back to work or drive until your surgeon says you can °• You may have sex when you feel comfortable °o It is VERY important for female patients to use a reliable birth control method; fertility often increase after surgery °o Do not get pregnant for at least 18 months °• Start exercising as soon as your doctor tells you that you can °o Make sure your doctor approves any physical activity °• Start with a simple walking program °• Walk 5-15 minutes each day, 7 days per week °• Slowly increase until you are walking 30-45 minutes per day °• Consider joining our [employer] program. [phone] or email [email or web address] °  °Special Instructions Things to remember: °• Free counseling is available for you and your family through collaboration between [facility] and [facility]. Please call [phone] and leave a message °• Use your CPAP when sleeping if this applies to you °• Consider buying a medical alert bracelet that says you had lap-band surgery °  °  You will likely have your first fill (fluid added to your band) 6 - 8 weeks after surgery °• [facility] has a free Bariatric Surgery Support Group that meets monthly, the 3rd Thursday, 6pm. [facility] Classrooms. You can see classes online at [email or web address] °• It is very important to keep all follow up appointments with your surgeon, nutritionist, primary care physician, and behavioral health practitioner °o After the first year, please follow up with your bariatric surgeon and nutritionist at least once a year in order to maintain best weight loss results °      °             [facility]:  [phone] ° °             [facility]: [phone] ° °             Bariatric Nurse Coordinator: [phone]  °Gastric Bypass/Sleeve Home Care   Instructions  Rev. 07/2012    ° °                                                    Reviewed and Endorsed °                                                   by  Patient Education Committee, Jan, 2014 ° ° ° ° ° ° ° ° ° °

## 2016-02-03 NOTE — H&P (Signed)
History of Present Illness [name] Gonzales(Evana Runnels A. Hattie Pine MD; 01/30/2016 12:15 PM) Patient words: Sleeve Pre Op.  The patient is a 25 year old female who presents for a bariatric surgery evaluation. She has completed all preoperative requirements including smoking cessation and is ready to proceed with laparoscopic sleeve gastrectomy. She was started on Humira a few months ago for her hidradenitis with some improvement. She is still making changes to her diet. She is on preoperative diet and notes improvement in her energy level.   Allergies Geanie Kenning(Eddiena Albanese, CMA; 01/30/2016 10:56 AM) Penicillin G Benzathine & Proc *PENICILLINS*  Medication History Geanie Kenning(Eddiena Albanese, CMA; 01/30/2016 10:56 AM) Losartan Potassium (100MG  Tablet, Oral daily) Active. Vitamin D2 (2000UNIT Tablet, Oral daily) Active. MetFORMIN HCl ER (500MG  Tablet ER 24HR, Oral daily) Active. Adipex-P (37.5MG  Tablet, Oral daily) Active. Cryselle-28 (0.3-30MG -MCG Tablet, Oral daily) Active. Medications Reconciled    Review of Systems [name] Gonzales(Roylene Heaton A. Shera Laubach MD; 01/30/2016 12:15 PM) General Present- Fever. Not Present- Anorexia, Appetite Loss and Chills. Skin Present- Rash. Not Present- Brittle Nails and Bruising. HEENT Not Present- Headache. Neck Not Present- Neck Mass and Neck Pain. Respiratory Not Present- Bloody sputum and Chronic Cough. Breast Present- Breast Pain. Not Present- Breast Mass. Cardiovascular Not Present- Hypertension and Irregular Heart Beat. Gastrointestinal Not Present- Constipation, Diarrhea, Difficulty Swallowing and Dysphagia. Female Genitourinary Not Present- Dyspareunia and Dysuria. Musculoskeletal Not Present- Calf Pain and Decreased Range of Motion. Neurological Not Present- Dizziness, Dysarthria and Dysesthesia.  Vitals Lynne Leader(Eddiena Albanese CMA; 01/30/2016 10:55 AM) 01/30/2016 10:54 AM Weight: 323.6 lb Height: 64in Body Surface Area: 2.4 m Body Mass Index: 55.55 kg/m  Temp.: 7F(Temporal)   Pulse: 78 (Regular)  P.OX: 99% (Room air) BP: 124/80 (Sitting, Left Arm, Standard)       Physical Exam [name] Gonzales(Yaroslav Gombos A. Shadavia Dampier MD; 01/30/2016 12:15 PM) General Mental Status-Alert. General Appearance-Cooperative. Orientation-Oriented X4. Build & Nutrition-Obese. Posture-Normal posture.  Integumentary Global Assessment Upon inspection and palpation of skin surfaces of the - Head/Face: no rashes, ulcers, lesions or evidence of photo damage. No palpable nodules or masses and Neck: no visible lesions or palpable masses.  Head and Neck Head-normocephalic, atraumatic with no lesions or palpable masses. Face Global Assessment - atraumatic. Thyroid Gland Characteristics - normal size and consistency.  Eye Eyeball - Bilateral-Extraocular movements intact. Sclera/Conjunctiva - Bilateral-No scleral icterus, No Discharge.  ENMT Nose and Sinuses External Inspection of the Nose - no deformities observed, no swelling present.  Chest and Lung Exam Palpation Palpation of the chest reveals - Non-tender. Auscultation Breath sounds - Normal.  Cardiovascular Auscultation Rhythm - Regular. Heart Sounds - S1 WNL and S2 WNL. Carotid arteries - No Carotid bruit.  Abdomen Inspection Inspection of the abdomen reveals - No Visible peristalsis, No Abnormal pulsations and No Paradoxical movements. Palpation/Percussion Palpation and Percussion of the abdomen reveal - Soft, Non Tender, No Rebound tenderness, No Rigidity (guarding), No hepatosplenomegaly and No Palpable abdominal masses. Note: periumbilical scar   Peripheral Vascular Upper Extremity Palpation - Pulses bilaterally normal. Lower Extremity Palpation - Edema - Bilateral - No edema.  Neurologic Neurologic evaluation reveals -normal sensation and normal coordination.  Neuropsychiatric Mental status exam performed with findings of-able to articulate well with normal speech/language, rate, volume and coherence  and thought content normal with ability to perform basic computations and apply abstract reasoning.  Musculoskeletal Normal Exam - Bilateral-Upper Extremity Strength Normal and Lower Extremity Strength Normal.    Assessment & Plan [name] Gonzales(Quanta Robertshaw A. Linc Renne MD; 01/30/2016 12:16 PM) MORBID OBESITY (E66.7001) Story: 25 year old female with morbid obesity with  BMI 52. She also has diabetes being treated with metformin. She is an excellent job of removing sugar beverages and smoking from her lifestyle. She is ready to proceed with laparoscopic sleeve gastrectomy. Impression: The patient meets weight loss surgery criteria. Due to the above reasons, I think laparoscopic vertical sleeve gastrectomy is the best option for the patient.  We discussed LSG. We discussed the preoperative, operative and postoperative process. I explained the surgery in detail including the performance of an EGD near the end of the surgery to test for leak. We discussed the typical hospital course including a 2-3 day stay baring any complications. The patient was given educational material. I quoted the patient that most patients can lose up to 50-70% of their excess weight. We did discuss the possibility of weight regain several years after the procedure.  The risks of infection, bleeding, pain, scarring, weight regain, too little or too much weight loss, vitamin deficiencies and need for lifelong vitamin supplementation, hair loss, need for protein supplementation, leaks, stricture, reflux, food intolerance, gallstone formation, hernia, need for reoperation, need for open surgery, injury to spleen or surrounding structures, DVT's, PE, and death again discussed with the patient and the patient expressed understanding and desires to proceed with laparoscopic sleeve gastrectomy, possible open, intraoperative endoscopy.  We discussed that before and after surgery that there would be an alteration in their diet. I explained that we have put  them on a diet 2 weeks before surgery. I also explained that they would be on a liquid diet for 2 weeks after surgery. We discussed that they would have to avoid certain foods after surgery. We discussed the importance of physical activity as well as compliance with our dietary and supplement recommendations and routine follow-up.  I explained to the patient that we will start our evaluation process which includes labs, Upper GI to evaluate stomach and swallowing anatomy, dietary consultation, psychology consult

## 2016-02-03 NOTE — Anesthesia Procedure Notes (Signed)
Procedure Name: Intubation Date/Time: 02/03/2016 7:24 AM Performed by: Epimenio SarinJARVELA, Ketzia Guzek R Pre-anesthesia Checklist: Patient identified, Emergency Drugs available, Suction available, Patient being monitored and Timeout performed Patient Re-evaluated:Patient Re-evaluated prior to inductionOxygen Delivery Method: Circle system utilized Preoxygenation: Pre-oxygenation with 100% oxygen Intubation Type: IV induction Ventilation: Mask ventilation without difficulty and Oral airway inserted - appropriate to patient size Laryngoscope Size: Mac and 3 Grade View: Grade II Tube type: Oral Tube size: 7.5 mm Number of attempts: 1 Airway Equipment and Method: Stylet Placement Confirmation: ETT inserted through vocal cords under direct vision,  positive ETCO2 and breath sounds checked- equal and bilateral Secured at: 22 cm Tube secured with: Tape Dental Injury: Teeth and Oropharynx as per pre-operative assessment  Comments: Grade 1/2 view. ETT easily passed through cords.

## 2016-02-03 NOTE — Anesthesia Postprocedure Evaluation (Signed)
Anesthesia Post Note  Patient: [name] Gonzales  Procedure(s) Performed: Procedure(s) (LRB): LAPAROSCOPIC GASTRIC SLEEVE RESECTION WITH UPPER ENDO (N/A)  Patient location during evaluation: PACU Anesthesia Type: General Level of consciousness: awake and alert Pain management: pain level controlled Vital Signs Assessment: post-procedure vital signs reviewed and stable Respiratory status: spontaneous breathing, nonlabored ventilation, respiratory function stable and patient connected to nasal cannula oxygen Cardiovascular status: blood pressure returned to baseline and stable Postop Assessment: no signs of nausea or vomiting Anesthetic complications: no    Last Vitals:  Vitals:   02/03/16 1000 02/03/16 1015  BP: 134/81 (!) 146/95  Pulse: 80 80  Resp: (!) 27 19  Temp: 36.7 C 37 C    Last Pain:  Vitals:   02/03/16 1000  TempSrc:   PainSc: 3                  Shelton SilvasKevin D Marjarie Irion

## 2016-02-03 NOTE — Transfer of Care (Signed)
Immediate Anesthesia Transfer of Care Note  Patient: [name] Gonzales  Procedure(s) Performed: Procedure(s): LAPAROSCOPIC GASTRIC SLEEVE RESECTION WITH UPPER ENDO (N/A)  Patient Location: PACU  Anesthesia Type:General  Level of Consciousness:  sedated, patient cooperative and responds to stimulation  Airway & Oxygen Therapy:Patient Spontanous Breathing and Patient connected to face mask oxgen  Post-op Assessment:  Report given to PACU RN and Post -op Vital signs reviewed and stable  Post vital signs:  Reviewed and stable  Last Vitals:  Vitals:   02/03/16 0542  BP: 129/81  Pulse: 74  Resp: 18  Temp: 36.9 C    Complications: No apparent anesthesia complications

## 2016-02-03 NOTE — Op Note (Signed)
Name:  [name] Gonzales MRN: [id number] Date of Surgery: 02/03/2016  Preop Diagnosis:  Morbid Obesity  Postop Diagnosis:  Morbid Obesity (Weight - 323, BMI - 55.5), S/P Gastric Sleeve  Procedure:  Upper endoscopy  (Intraoperative)  Surgeon:  Ovidio Kinavid Seirra Kos, M.D.  Anesthesia:  GET  Indications for procedure: [name] Gonzales is a 25 y.o. female whose primary care physician is No PCP Per Patient and has completed a Gastric Sleeve today by Dr. Sheliah HatchKinsinger.  I am doing an intraoperative upper endoscopy to evaluate the gastric pouch.  Operative Note: The patient is under general anesthesia.  Dr. Sheliah HatchKinsinger is laparoscoping the patient while I do an upper endoscopy to evaluate the stomach pouch.  With the patient intubated, I passed the Pentax upper endoscope without difficulty down the esophagus.  The esophago-gastric junction was at 39 cm.    The mucosa of the stomach looked viable and the staple line was intact without bleeding.  I advanced to the pylorus, but did not go through it.  While I insufflated the stomach pouch with air, Dr. Sheliah HatchKinsinger  flooded the upper abdomen with saline to put the gastric pouch under saline.  There was no bubbling or evidence of a leak.  There was no evidence of narrowing of the pouch and the gastric sleeve looked tubular.  The scope was then withdrawn.  The esophagus was unremarkable and the patient tolerated the endoscopy without difficulty.  Ovidio Kinavid Ymani Porcher, MD, [facility] Pager: [phone] Office phone:  [phone]

## 2016-02-03 NOTE — Interval H&P Note (Signed)
History and Physical Interval Note:  02/03/2016 7:08 AM  Bernerd LimboNucola Cope  has presented today for surgery, with the diagnosis of MORBID OBESITY  The various methods of treatment have been discussed with the patient and family. After consideration of risks, benefits and other options for treatment, the patient has consented to  Procedure(s): LAPAROSCOPIC GASTRIC SLEEVE RESECTION WITH UPPER ENDO (N/A) as a surgical intervention .  The patient's history has been reviewed, patient examined, no change in status, stable for surgery.  I have reviewed the patient's chart and labs.  Questions were answered to the patient's satisfaction.     De BlanchLuke Aaron Mikle Sternberg

## 2016-02-04 LAB — CBC WITH DIFFERENTIAL/PLATELET
Basophils Absolute: 0 10*3/uL (ref 0.0–0.1)
Basophils Relative: 0 %
EOS ABS: 0 10*3/uL (ref 0.0–0.7)
EOS PCT: 0 %
HCT: 40.1 % (ref 36.0–46.0)
HEMOGLOBIN: 13.2 g/dL (ref 12.0–15.0)
LYMPHS ABS: 1.5 10*3/uL (ref 0.7–4.0)
LYMPHS PCT: 15 %
MCH: 24.6 pg — AB (ref 26.0–34.0)
MCHC: 32.9 g/dL (ref 30.0–36.0)
MCV: 74.8 fL — AB (ref 78.0–100.0)
MONOS PCT: 7 %
Monocytes Absolute: 0.7 10*3/uL (ref 0.1–1.0)
Neutro Abs: 7.9 10*3/uL — ABNORMAL HIGH (ref 1.7–7.7)
Neutrophils Relative %: 78 %
PLATELETS: 413 10*3/uL — AB (ref 150–400)
RBC: 5.36 MIL/uL — AB (ref 3.87–5.11)
RDW: 15.3 % (ref 11.5–15.5)
WBC: 10.2 10*3/uL (ref 4.0–10.5)

## 2016-02-04 LAB — COMPREHENSIVE METABOLIC PANEL
ALK PHOS: 42 U/L (ref 38–126)
ALT: 38 U/L (ref 14–54)
ANION GAP: 10 (ref 5–15)
AST: 29 U/L (ref 15–41)
Albumin: 4.7 g/dL (ref 3.5–5.0)
BUN: 8 mg/dL (ref 6–20)
CALCIUM: 9 mg/dL (ref 8.9–10.3)
CO2: 26 mmol/L (ref 22–32)
CREATININE: 0.54 mg/dL (ref 0.44–1.00)
Chloride: 100 mmol/L — ABNORMAL LOW (ref 101–111)
Glucose, Bld: 90 mg/dL (ref 65–99)
Potassium: 3.8 mmol/L (ref 3.5–5.1)
SODIUM: 136 mmol/L (ref 135–145)
Total Bilirubin: 0.6 mg/dL (ref 0.3–1.2)
Total Protein: 8.5 g/dL — ABNORMAL HIGH (ref 6.5–8.1)

## 2016-02-04 NOTE — Progress Notes (Signed)
Patient alert and oriented, pain is controlled. Patient is tolerating fluids, advanced to protein shake today, patient is tolerating well.  Reviewed Gastric sleeve discharge instructions with patient and patient is able to articulate understanding.  Provided information on BELT program, Support Group and [facility] outpatient pharmacy. All questions answered, will continue to monitor.  

## 2016-02-04 NOTE — Progress Notes (Signed)
Discharge instructions discussed with patient, verbalized agreement and understanding 

## 2016-02-06 ENCOUNTER — Ambulatory Visit: Payer: BLUE CROSS/BLUE SHIELD | Admitting: Dietician

## 2016-02-09 NOTE — Discharge Summary (Signed)
Physician Discharge Summary  [name] Gonzales JYN:[id number] DOB: 10/08/1990 DOA: 02/03/2016  PCP: No PCP Per Patient  Admit date: 02/03/2016 Discharge date: 02/09/2016  Recommendations for Outpatient Follow-up:  1.  (include homehealth, outpatient follow-up instructions, specific recommendations for PCP to follow-up on, etc.)  Follow-up Information    Rodman PickleLuke Aaron Vickye Astorino, MD .   Specialty:  General Surgery Contact information: [address] [place] [address] [phone]        Rodman PickleLuke Aaron Barbra Miner, MD .   Specialty:  General Surgery Contact information: [address] [place] [address] [phone]          Discharge Diagnoses:  Active Problems:   Morbid obesity (HCC)   Surgical Procedure: Laparoscopic Sleeve Gastrectomy, upper endoscopy  Discharge Condition: Good Disposition: Home  Diet recommendation: Postoperative sleeve gastrectomy diet (liquids only)  Filed Weights   02/03/16 0542 02/03/16 0556  Weight: (!) 146.7 kg (323 lb 6.4 oz) (!) 146.5 kg (323 lb)     Hospital Course:  The patient was admitted after undergoing laparoscopic sleeve gastrectomy. POD 0 she ambulated well. POD 1 she was started on the water diet protocol and tolerated 400 ml in the first shift. Once meeting the water amount she was advanced to bariatric protein shakes which they tolerated and were discharged home POD 1.  Treatments: surgery: laparoscopic sleeve gastrectomy  Discharge Instructions  Discharge Instructions    Call MD for:  difficulty breathing, headache or visual disturbances    Complete by:  As directed   Call MD for:  persistant nausea and vomiting    Complete by:  As directed   Call MD for:  redness, tenderness, or signs of infection (pain, swelling, redness, odor or green/yellow discharge around incision site)    Complete by:  As directed   Call MD for:  severe uncontrolled pain    Complete by:  As directed   Call MD for:  temperature  >100.4    Complete by:  As directed   Discharge wound care:    Complete by:  As directed   Ok to shower tomorrow  Glue will likely peel off in 1-3 weeks   Increase activity slowly    Complete by:  As directed   Lifting restrictions    Complete by:  As directed   Do not lift more than 20 pounds for 3-4 weeks       Medication List    TAKE these medications   esomeprazole 20 MG capsule Commonly known as:  NEXIUM Take 20 mg by mouth at bedtime.   gabapentin 100 MG capsule Commonly known as:  NEURONTIN Take 100 mg by mouth at bedtime.   ranitidine 150 MG tablet Commonly known as:  ZANTAC Take 150 mg by mouth at bedtime.      Follow-up Information    Rodman PickleLuke Aaron Dennies Coate, MD .   Specialty:  General Surgery Contact information: [address] [place] [address] [phone]        Rodman PickleLuke Aaron Coraleigh Sheeran, MD .   Specialty:  General Surgery Contact information: [address] [place] [address] [phone]            The results of significant diagnostics from this hospitalization (including imaging, microbiology, ancillary and laboratory) are listed below for reference.    Significant Diagnostic Studies: No results found.  Labs: Basic Metabolic Panel:  Recent Labs Lab 02/03/16 1153 02/04/16 0507  NA  --  136  K  --  3.8  CL  --  100*  CO2  --  26  GLUCOSE  --  90  BUN  --  8  CREATININE 0.50 0.54  CALCIUM  --  9.0   Liver Function Tests:  Recent Labs Lab 02/04/16 0507  AST 29  ALT 38  ALKPHOS 42  BILITOT 0.6  PROT 8.5*  ALBUMIN 4.7    CBC:  Recent Labs Lab 02/03/16 1153 02/04/16 0507  WBC 13.4* 10.2  NEUTROABS  --  7.9*  HGB 12.7 13.2  HCT 39.6 40.1  MCV 76.2* 74.8*  PLT 442* 413*    CBG:  Recent Labs Lab 02/03/16 0916  GLUCAP 138*    Active Problems:   Morbid obesity (HCC)   Time coordinating discharge: <3715min

## 2016-02-17 ENCOUNTER — Encounter: Payer: BLUE CROSS/BLUE SHIELD | Attending: General Surgery

## 2016-02-17 NOTE — Progress Notes (Signed)
Bariatric Class:  Appt start time: 1530 end time:  1630.  2 Week Post-Operative Nutrition Class  Patient was seen on 02/17/2016 for Post-Operative Nutrition [facility].   Surgery date: 02/03/2016 Surgery type: sleeve gastrectomy Start weight at [facility]: 326 lbs on 01/09/2016, 327.4 on 01/19/2016 Weight today: 304.6 lbs  Weight change: 22.8 lbs  TANITA  BODY COMP RESULTS  01/19/16 02/17/16   BMI (kg/m^2) 49.8 46.3   Fat Mass (lbs) 180.8 164.2   Fat Free Mass (lbs) 146.6 140.4   Total Body Water (lbs) 111.4 105.8   The following the learning objectives were met by the patient during this course:  Identifies Phase 3A (Soft, High Proteins) Dietary Goals and will begin from 2 weeks post-operatively to 2 months post-operatively  Identifies appropriate sources of fluids and proteins   States protein recommendations and appropriate sources post-operatively  Identifies the need for appropriate texture modifications, mastication, and bite sizes when consuming solids  Identifies appropriate multivitamin and calcium sources post-operatively  Describes the need for physical activity post-operatively and will follow MD recommendations  States when to call healthcare provider regarding medication questions or post-operative complications  Handouts given during class include:  Phase 3A: Soft, High Protein Diet Handout  Follow-Up Plan: Patient will follow-up at [facility] in 6 weeks for 2 month post-op nutrition visit for diet advancement per MD.

## 2016-03-31 ENCOUNTER — Encounter: Payer: BLUE CROSS/BLUE SHIELD | Attending: General Surgery | Admitting: Dietician

## 2016-03-31 NOTE — Patient Instructions (Addendum)
Goals:  Follow Phase 3B: High Protein + Non-Starchy Vegetables  Eat 3-6 small meals/snacks, every 3-5 hrs  Increase lean protein foods to meet 60g goal  Increase fluid intake to 64oz +  Avoid drinking 15 minutes before, during and 30 minutes after eating  Aim for >30 min of physical activity daily  Have a protein shake each day  Take 2 citrical petites 3 x day  Take multivitamins that you can swallow (3 per day, separate multi and calcium by 2 hours each) Bariatric Advantage Ultra Multi with Iron - 3 per day ($51.95 for a 90 day supply).   Celebrate Multi-Complete 36/45/60 with Iron - 3 per day  ($49.95 for 90 day supply)   Surgery date: 02/03/2016 Surgery type: [facility] gast[facility] lbs on 01/09/2016, 327.4 on 01/19/2016 Weight today: 283.8 lbs  Weight change: 20.8 lbs  TANITA  BODY COMP RESULTS  01/19/16 02/17/16 03/31/16   BMI (kg/m^2) 49.8 46.3 43.2   Fat Mass (lbs) 180.8 164.2 147.8   Fat Free Mass (lbs) 146.6 140.4 136.0   Total Body Water (lbs) 111.4 105.8 101.8

## 2016-03-31 NOTE — Progress Notes (Signed)
  Follow-up visit:  2 Months Post-Operative Sleeve Gastrectomy Surgery  Medical Nutrition Therapy:  Appt start time: 0945 end time:  1020.  Primary concerns today: Post-operative Bariatric Surgery Nutrition Management. Has lost about 20.8 lbs in the past 6 weeks. Can tolerated eggs and chicken. Dry chicken has felt stuck. Shrimp doesn't work. Doesn't feel hungry much but craves water. Can't tolerate sweets like cranberry juice. Tried potatoes and can't tolerate them.   Surgery date: 02/03/2016 Surgery type: sleeve gastrectomy [facility] 326 lbs on 01/09/2016, 327.4 on 01/19/2016 Weight today: 283.8 lbs  Weight change: 20.8 lbs  TANITA  BODY COMP RESULTS  01/19/16 02/17/16 03/31/16   BMI (kg/m^2) 49.8 46.3 43.2   Fat Mass (lbs) 180.8 164.2 147.8   Fat Free Mass (lbs) 146.6 140.4 136.0   Total Body Water (lbs) 111.4 105.8 101.8    Preferred Learning Style:   No preference indicated   Learning Readiness:   Ready  24-hr recall: B (AM): 3 x week protein shake or yogurt or on weekend Malaw[place]urkey sausage and boiled egg (12-30 g) Snk (AM): none L (PM): 1-2 oz baked chicken or meat from lunchables (7-14 g) Snk (PM): 4-5 cheezit crackers  D (PM): 1-2 oz chicken with cabbage and green beans (7-14 g) Snk (PM): sugar free jello  Fluid intake: 8 oz diet cranberry juice, 64 oz or more water, protein shake 3 x week  Estimated total protein intake: 26-58 g  Medications: see list  Supplementation: taking citrical petites 2 x day, taking Vitamin B12  Using straws: No Drinking while eating: No Hair loss: No Carbonated beverages: No N/V/D/C: gets nausea every once in a while, has some constipation and takes Milk of Magnesia every once awhile  Dumping syndrome: None recently   Recent physical activity:  Walking 7 days per week from 30-60 minutes  Progress Towards Goal(s):  In progress.  Handouts given during visit include:  High Protein + Non Starchy Vegetables   Nutritional  Diagnosis:  [place]-3.3 Overweight/obesity related to past poor dietary habits and physical inactivity as evidenced by patient w/ recent sleeve gastrectomy surgery following dietary guidelines for continued weight loss.    Intervention:  Nutrition education/diet advancement. Goals:  Follow Phase 3B: High Protein + Non-Starchy Vegetables  Eat 3-6 small meals/snacks, every 3-5 hrs  Increase lean protein foods to meet 60g goal  Increase fluid intake to 64oz +  Avoid drinking 15 minutes before, during and 30 minutes after eating  Aim for >30 min of physical activity daily  Have a protein shake each day  Take 2 citrical petites 3 x day  Take multivitamins that you can swallow (3 per day, separate multi and calcium by 2 hours each) Bariatric Advantage Ultra Multi with Iron - 3 per day ($51.95 for a 90 day supply).   Celebrate Multi-Complete 36/45/60 with Iron - 3 per day  ($49.95 for 90 day supply)   Teaching Method Utilized:  Visual Auditory Hands on  Barriers to learning/adherence to lifestyle change: none  Demonstrated degree of understanding via:  Teach Back   Monitoring/Evaluation:  Dietary intake, exercise, and body weight. Follow up in 1 months for 3 month post-op visit.

## 2016-04-30 ENCOUNTER — Emergency Department (HOSPITAL_COMMUNITY)
Admission: EM | Admit: 2016-04-30 | Discharge: 2016-04-30 | Disposition: A | Discharge status: Home / Self Care | Payer: BLUE CROSS/BLUE SHIELD | Attending: Emergency Medicine | Admitting: Emergency Medicine

## 2016-04-30 ENCOUNTER — Encounter (HOSPITAL_COMMUNITY): Payer: Self-pay | Admitting: Emergency Medicine

## 2016-04-30 DIAGNOSIS — F17210 Nicotine dependence, cigarettes, uncomplicated: Secondary | ICD-10-CM | POA: Insufficient documentation

## 2016-04-30 DIAGNOSIS — N611 Abscess of the breast and nipple: Secondary | ICD-10-CM | POA: Diagnosis not present

## 2016-04-30 MED ORDER — OXYCODONE-ACETAMINOPHEN 5-325 MG PO TABS: 1.0000 | ORAL_TABLET | ORAL | 0 refills | Status: DC | PRN

## 2016-04-30 NOTE — ED Triage Notes (Signed)
Pt states she has an abscess under her right breast that started a couple weeks ago  Pt states she went to [facility] because that is who manages her abscesses and was given antibiotics  Pt states they have not helped and the area has gotten worse  Pt states she has been running a fever off and on and had one earlier this evening of 101 and took ibuprofen 800mg 

## 2016-04-30 NOTE — Discharge Instructions (Signed)
Take your medication as prescribed as needed for pain relief. You may also take 600mg  Ibuprofen every 6 hours as needed for additional pain relief. Continue taking your home prescription of clindamycin as prescribed until completed. I also recommend continuing to apply warm compresses to your breast for 15-20 minutes 3-4 times daily.  Follow up with your surgeon at [facility] within the next week for follow up evaluation and further management of your recurrent breast abscesses. Please return to the Emergency Department if symptoms worsen or new onset of fever, chest pain, difficulty breathing, worsening redness/swelling/warmth, vomiting, abdominal pain.

## 2016-04-30 NOTE — ED Provider Notes (Signed)
[facility]-EMERGENCY DEPT Provider Note   CSN: [id number] Arrival date & time: 04/30/16  0353     History   Chief Complaint Chief Complaint  Patient presents with  . Abscess    HPI [name] Gonzales is a 25 y.o. female.  HPI   Patient is a [age] year old female with history of diabetes and hidradenitis suppurativa who presents the ED with complaint of right breast abscess, onset one week. Patient reports having worsening swelling and pain to her right inferior breast over the past week. She notes she was initially evaluated by [facility] a few weeks ago for recurrent abscesses and was placed on doxycycline. She reports taking Doxy for 14 days without relief. She states she had a follow-up appointment at the outpatient surgery clinic 3 days ago and was started on clindamycin. Patient denies improvement of symptoms. Reports having fever of 101 at home and notes she took Ibuprofen around 7pm last night. Patient reports having initial relief with ibuprofen at home but states she has had no relief over the past 1-2 days. Reports using warm compresses without relief. Denies redness, warmth, drainage, chest pain, SOB, abdominal pain, vomiting.   Past Medical History:  Diagnosis Date  . Bacterial vaginosis   . Bronchitis   . Chlamydia   . Diabetes mellitus   . GERD (gastroesophageal reflux disease)   . Gonorrhea   . Hidradenitis suppurativa   . Neuromuscular disorder (HCC)    neuropathy  . Paresthesia    hands, feet  . Trichomoniasis   . UTI (urinary tract infection)     Patient Active Problem List   Diagnosis Date Noted  . Morbid obesity (HCC) 02/03/2016  . Hidradenitis suppurativa 07/09/2015  . Screening examination for venereal disease 07/09/2015  . Pilonidal disease 10/18/2013    Past Surgical History:  Procedure Laterality Date  . LAPAROSCOPIC GASTRIC SLEEVE RESECTION N/A 02/03/2016   Procedure: LAPAROSCOPIC GASTRIC SLEEVE RESECTION WITH UPPER ENDO;  Surgeon: De BlanchLuke Aaron  Kinsinger, MD;  Location: [facility];  Service: General;  Laterality: N/A;  . OOPHORECTOMY  11/2009   left  . SALPINGOOPHORECTOMY    . TONSILLECTOMY  2002    OB History    No data available       Home Medications    Prior to Admission medications   Medication Sig Start Date End Date Taking? Authorizing Provider  esomeprazole (NEXIUM) 20 MG capsule Take 20 mg by mouth at bedtime.     Historical Provider, MD  gabapentin (NEURONTIN) 100 MG capsule Take 100 mg by mouth at bedtime.     Historical Provider, MD  oxyCODONE-acetaminophen (PERCOCET/ROXICET) 5-325 MG tablet Take 1 tablet by mouth every 4 (four) hours as needed for severe pain. 04/30/16   Barrett HenleNicole Elizabeth Loranda Mastel, PA-C  ranitidine (ZANTAC) 150 MG tablet Take 150 mg by mouth at bedtime.     Historical Provider, MD  spironolactone (ALDACTONE) 25 MG tablet Take 25 mg by mouth daily.    Historical Provider, MD    Family History Family History  Problem Relation Age of Onset  . Stroke Mother     2010  . Heart attack Mother     2010  . Hypertension Mother   . Lupus Sister     Social History Social History  Substance Use Topics  . Smoking status: Current Some Day Smoker    Packs/day: 0.25    Types: Cigarettes    Last attempt to quit: 01/19/2014  . Smokeless tobacco: Never Used  . Alcohol use 0.6 oz/week  1 Standard drinks or equivalent per week     Allergies   Penicillins   Review of Systems Review of Systems  Constitutional: Positive for fever.  Skin: Positive for wound (abscess).  All other systems reviewed and are negative.    Physical Exam Updated Vital Signs BP 132/82 (BP Location: Left Arm)   Pulse 67   Temp 98.5 F (36.9 C) (Oral)   Resp 16   LMP 04/04/2016 (Approximate)   SpO2 99%   Physical Exam  Constitutional: She is oriented to person, place, and time. She appears well-developed and well-nourished.  HENT:  Head: Normocephalic and atraumatic.  Eyes: Conjunctivae and EOM are normal. Right  eye exhibits no discharge. Left eye exhibits no discharge. No scleral icterus.  Neck: Normal range of motion. Neck supple.  Cardiovascular: Normal rate, regular rhythm, normal heart sounds and intact distal pulses.   Pulmonary/Chest: Effort normal and breath sounds normal. No respiratory distress. She has no wheezes. She has no rales. She exhibits no tenderness. Right breast exhibits tenderness. Right breast exhibits no inverted nipple, no mass, no nipple discharge and no skin change. Left breast exhibits no inverted nipple, no mass, no nipple discharge, no skin change and no tenderness.    Abdominal: Soft. There is no tenderness.  Musculoskeletal: Normal range of motion. She exhibits no edema.  Neurological: She is alert and oriented to person, place, and time.  Skin: Skin is warm and dry.  1x4cm area of induration and TTP noted to right inferior lateral breast. No surrounding swelling, erythema, warmth, fluctuance or drainage noted. Well-healed I&D scar noted to right inferiolateral breast present.  Nursing note and vitals reviewed.    ED Treatments / Results  Labs (all labs ordered are listed, but only abnormal results are displayed) Labs Reviewed - No data to display  EKG  EKG Interpretation None       Radiology No results found.  Procedures Procedures (including critical care time)  Medications Ordered in ED Medications - No data to display   Initial Impression / Assessment and Plan / ED Course  I have reviewed the triage vital signs and the nursing notes.  Pertinent labs & imaging results that were available during my care of the patient were reviewed by me and considered in my medical decision making (see chart for details).  Clinical Course     Pt presents with recurrent breast abscess. Hx of hidradenitis suppurativa. Reports [facility] regarding her recurrent abscesses. Notes she was on Doxycyline for 2 weeks, [facility] and was placed on clindamycin. VSS. Exam revealed 1x4cm area of induration noted to right inferior/lateral breast; no evidence of fluctuance, drainage or abscess. No signs of associated cellulitis. Remaining exam unremarkable. Due to pt with recurrent abscesses, advised pt to continue taking her Clindamycin as prescribed. Advised patient to call general surgery clinic this morning to schedule a follow-up appointment for further management of her recurrent breast abscess. Patient discharged home with pain meds and despite treatment. Discussed return precautions with patient.  Final Clinical Impressions(s) / ED Diagnoses   Final diagnoses:  Breast abscess    New Prescriptions New Prescriptions   OXYCODONE-ACETAMINOPHEN (PERCOCET/ROXICET) 5-325 MG TABLET    Take 1 tablet by mouth every 4 (four) hours as needed for severe pain.     Satira Sarkicole Elizabeth StonegateNadeau, [place] JerseyPA-C 04/30/16 [id number]    Dione Boozeavid Glick, MD 05/01/16 [phone]

## 2016-04-30 NOTE — ED Notes (Signed)
Pt states that she has been taking ABX Doxycycline, and now is taking clindamycin x 1 month for abscess on the inferior region of breast, and Ibuprofen 800mg  for pain. However today  As per patient pain and growth of abscess  Increased on  her right side  and pain medication is ineffective. Pt has abscess noted to bilateral breast, that are tender

## 2016-05-05 ENCOUNTER — Encounter: Payer: BLUE CROSS/BLUE SHIELD | Attending: General Surgery | Admitting: Dietician

## 2016-05-05 NOTE — Patient Instructions (Addendum)
Goals:  Follow Phase 3B: High Protein + Non-Starchy Vegetables  Eat 3-6 small meals/snacks, every 3-5 hrs  Increase lean protein foods to meet 60g goal  Increase fluid intake to 64oz +  Avoid drinking 15 minutes before, during and 30 minutes after eating  Aim for >30 min of physical activity daily  Try to add more vegetables at meals  Cut out fruit if you feel like weight loss is slowing  Have G2 or Powerade Zero if you wants a sports drink  Add an additional iron (325 mg)  Surgery date: 02/03/2016 Surgery type: sleeve gastrectomy [facility]: 326 lbs on 01/09/2016, 327.4 on 01/19/2016 Weight today: 270.0 lbs   Weight change: 13.8 lbs Total weight loss: 57.4  TANITA  BODY COMP RESULTS  01/19/16 02/17/16 03/31/16 05/05/16   BMI (kg/m^2) 49.8 46.3 43.2 41.1   Fat Mass (lbs) 180.8 164.2 147.8 137.4   Fat Free Mass (lbs) 146.6 140.4 136.0 132.6   Total Body Water (lbs) 111.4 105.8 101.8 99.0

## 2016-05-05 NOTE — Progress Notes (Signed)
  Follow-up visit:  3 Months Post-Operative Sleeve Gastrectomy Surgery  Medical Nutrition Therapy:  Appt start time: 1000 end time:  1020  Primary concerns today: Post-operative Bariatric Surgery Nutrition Management. Has lost about 13.8 lbs in the past 5 weeks. Feels weak about once per week. Feels like she may not be eating enough. Starting Vitamin D last week.  Started to crave fruit and salad. Can tolerate raw foods better than cooked foods.   Surgery date: 02/03/2016 Surgery type: [facility] gastrectomy [facility]: [facility] lbs on 01/09/2016, 327.4 on 01/19/2016 Weight today: 270.0 lbs   Weight change: 13.8 lbs Total weight loss: 57.4  TANITA  BODY COMP RESULTS  01/19/16 02/17/16 03/31/16 05/05/16   BMI (kg/m^2) 49.8 46.3 43.2 41.1   Fat Mass (lbs) 180.8 164.2 147.8 137.4   Fat Free Mass (lbs) 146.6 140.4 136.0 132.6   Total Body Water (lbs) 111.4 105.8 101.8 99.0    Preferred Learning Style:   No preference indicated   Learning Readiness:   Ready  24-hr recall: B (AM): 3 x week protein shake or yogurt or on weekend [place] sausage and boiled egg with kiwi/grapes/peaches sometimes (12-30 g) Snk (AM): Protein shake 3 x week (0-30 g) L (PM): 1-2 oz baked chicken or meat from lunchables (7-14 g) Snk (PM): yogurt or sugar free jello (0-12 g) D (PM): 1-2 oz chicken with cabbage and green beans (7-14 g) Snk (PM): yogurt or sugar free jello (0-12 g)  Fluid intake: 17-34 oz or more water, protein shake 11 oz, 17 oz Bai infusion, gatorade once per week (45-62 oz) Estimated total protein intake:at least 56 g  Medications: see list  Supplementation: taking l;iquid calcium, flinstone, taking Vitamin B12  Using straws: No Drinking while eating: No Hair loss: No Carbonated beverages: No N/V/D/C: gets nausea every once in a while, has some constipation and takes Milk of Magnesia every once awhile, take stool softeners, feels like Miralax doesn't work Dumping syndrome: None  recently   Recent physical activity:  Walking 7 days per week from 30-60 minutes  Progress Towards Goal(s):  In progress.  Handouts given during visit include:  none   Nutritional Diagnosis:  [place]-3.3 Overweight/obesity related to past poor dietary habits and physical inactivity as evidenced by patient w/ recent sleeve gastrectomy surgery following dietary guidelines for continued weight loss.    Intervention:  Nutrition education/diet advancement. Goals:  Follow Phase 3B: High Protein + Non-Starchy Vegetables  Eat 3-6 small meals/snacks, every 3-5 hrs  Increase lean protein foods to meet 60g goal  Increase fluid intake to 64oz +  Avoid drinking 15 minutes before, during and 30 minutes after eating  Aim for >30 min of physical activity daily  Try to add more vegetables at meals  Cut out fruit if you feel like weight loss is slowing  Have G2 or Powerade Zero if you wants a sports drink  Add an additional iron (325 mg)  Teaching Method Utilized:  Visual Auditory Hands on  Barriers to learning/adherence to lifestyle change: none  Demonstrated degree of understanding via:  Teach Back   Monitoring/Evaluation:  Dietary intake, exercise, and body weight. Follow up in 1 months for 4 month post-op visit.

## 2016-06-09 ENCOUNTER — Ambulatory Visit: Payer: BLUE CROSS/BLUE SHIELD | Admitting: Dietician

## 2016-06-29 ENCOUNTER — Encounter: Payer: Self-pay | Admitting: Skilled Nursing Facility1

## 2016-06-29 ENCOUNTER — Encounter: Payer: BLUE CROSS/BLUE SHIELD | Attending: General Surgery | Admitting: Skilled Nursing Facility1

## 2016-06-29 NOTE — Patient Instructions (Addendum)
   Aim for 60 grams of protein: have protein every time you eat, eat the protein first  Eat 3-6 small meals/snacks, every 3-5 hrs  Increase fluid intake to 64oz + sip on fluid all day long  Avoid drinking 15 minutes before, during and 30 minutes after eating  Aim for >30 min of physical activity daily  Try to add more vegetables at meals  Have G2 or Powerade Zero if you wants a sports drink  -Get the Bariatric specific multivitamin   -Chew your food until applesauce consistency and take very small bites (size of a dime)  -anything that swims is a great protein source   -If you are not happy with your weight loss stop eating the fruit and grits and juice  -Listen to your body and stop when you are satisfied   -Do not eat deep fried food   -Beans are good for constipation

## 2016-06-29 NOTE — Progress Notes (Signed)
Follow-up visit:  3 Months Post-Operative Sleeve Gastrectomy Surgery  Medical Nutrition Therapy:  Appt start time: 1000 end time:  1020  Primary concerns today: Post-operative Bariatric Surgery Nutrition Management. Has lost about 13.8 lbs in the past 5 weeks. Feels weak about once per week. Feels like she may not be eating enough. Starting Vitamin D last week.  Pt states she is drained all day and tosses and turns all night long with a restless feeling. Pt states she has never struggled with sleep before the surgery. Pt states she is waking every 2 hours. Pt states she has had horrible reflux and is taking medicine for that. Pt states she has been terribly constipated with added fiber to her drinks which as helped a little. Pt states she feels full all the time. Pt states she goes on average 2-3 days without a bowel movement. Pt states she craves foods differently now: she craves fruit which has resulted in a slower wt loss. Pt states she feels like her wt has not budged. Pt states she does not eat or drink anything until around 1pm on the weekends. Pt states she no longer takes any diabetes medications. Pt states she feels full constantly because she is constipated. Pt states she has been eating fruit (grapes, peaches, apples). Pt states she eats deep fried shrimp and breaded items.   Started to crave fruit and salad. Can tolerate raw foods better than cooked foods.   Surgery date: 02/03/2016 Surgery type: sleeve gastrectomy Start weight at [facility]: 326 lbs on 01/09/2016, 327.4 on 01/19/2016 Weight today: 263.6 lbs   Weight change: 6.4 lbs  TANITA  BODY COMP RESULTS  01/19/16 02/17/16 03/31/16 05/05/16 06/29/2016   BMI (kg/m^2) 49.8 46.3 43.2 41.1 40.1   Fat Mass (lbs) 180.8 164.2 147.8 137.4 129   Fat Free Mass (lbs) 146.6 140.4 136.0 132.6 134.6   Total Body Water (lbs) 111.4 105.8 101.8 99.0 100    Preferred Learning Style:   No preference indicated   Learning Readiness:    Ready  24-hr recall: B (AM): a bite of cressoint sandwich Snk (AM): protein shake (30 grams protein) L (PM): baked chicken wings with teriaky sauce 3 wingets Snk (PM): rice crispy treat (8 grams carbohydrate)  D (PM):   Snk (PM):   Fluid intake: crystal light 20 ounces and 11 ounces from protein shake about 31 fluid ounces: juice  Estimated total protein intake:  Medications: see list  Supplementation:not taking everyday: taking liquid calcium, vitamin B12 liquid, vitamin d  Using straws: NO Drinking while eating: YES (is not chewing thouroughly) Hair loss: NO Carbonated beverages: YES: gingerale N/V/D/C: gets nausea every once in a while with greasy foods, constipation often taking fiber supplement  Dumping syndrome: None recently   Recent physical activity:  Walking 7 days per week from 30-60 minutes  Progress Towards Goal(s):  In progress.  Handouts given during visit include:  none   Nutritional Diagnosis:  [place]-3.3 Overweight/obesity related to past poor dietary habits and physical inactivity as evidenced by patient w/ recent sleeve gastrectomy surgery following dietary guidelines for continued weight loss.    Intervention:  Nutrition education/diet advancement. Goals:  Follow Phase 3B: High Protein + Non-Starchy Vegetables  Eat 3-6 small meals/snacks, every 3-5 hrs  Increase lean protein foods to meet 60g goal  Increase fluid intake to 64oz +  Avoid drinking 15 minutes before, during and 30 minutes after eating  Aim for >30 min of physical activity daily  Try to add more  vegetables at meals  Cut out fruit if you feel like weight loss is slowing  Have G2 or Powerade Zero if you wants a sports drink -Get the Bariatric specific multivitamin   -Chew your food until applesauce consistency and take very small bites (size of a dime)  -anything that swims is a great protein source   -If you are not happy with your weight loss stop eating the fruit and grits  and juice  -Listen to your body and stop when you are satisfied   -Do not eat deep fried food  Teaching Method Utilized:  Visual Auditory Hands on  Barriers to learning/adherence to lifestyle change: none  Demonstrated degree of understanding via:  Teach Back   Monitoring/Evaluation:  Dietary intake, exercise, and body weight. Follow up in 1 months for 4 month post-op visit.

## 2016-08-06 ENCOUNTER — Other Ambulatory Visit (HOSPITAL_COMMUNITY): Payer: Self-pay | Admitting: General Surgery

## 2016-08-10 ENCOUNTER — Encounter: Payer: BLUE CROSS/BLUE SHIELD | Attending: General Surgery | Admitting: Skilled Nursing Facility1

## 2016-08-10 ENCOUNTER — Encounter: Payer: Self-pay | Admitting: Skilled Nursing Facility1

## 2016-08-10 DIAGNOSIS — E669 Obesity, unspecified: Secondary | ICD-10-CM

## 2016-08-10 DIAGNOSIS — E119 Type 2 diabetes mellitus without complications: Secondary | ICD-10-CM

## 2016-08-10 NOTE — Patient Instructions (Addendum)
-  Aim for 64 fluid ounces every day: will help with your constipation  -Do not take your calcium at the same time as your multivitamin  -Avoid corn  -For Snacks:  -1 piece of fruit Fruit  -Handful of Vegetables: carrot sticks, celery, cherry tomatoes, cucumbers  -1 small handful of nuts  -Protein shake  -one small bag of smart pop popcorn  -Quest Protein chips   -cheese stick  -1 slice of lunch meat with 1 slice of cheese roll up   -Be sure to bring water with you to the gym   -Do not weigh yourself more than once a week   -Try sugar free hard candy when your on your period   -Try out some black beans with plain greek yogurt and some shredded cheese   -Try to eat beans about 3 days a week  -Pull the skin off your chicken wings every time  -Stick with the clearer salad dressings: stir away from ranch   -Take bites the size of a dime and take your time: put your fork down in between bite   -[place], [place], [place]

## 2016-08-10 NOTE — Progress Notes (Signed)
Follow-up visit:  3 Months Post-Operative Sleeve Gastrectomy Surgery  Medical Nutrition Therapy:  Appt start time: 1000 end time:  1020  Primary concerns today: Post-operative Bariatric Surgery Nutrition Management.  Pt states her wt has not been stable with increases and decreases. Pt states she has been making some changes in the last week. Pt states she was dehydrated. Pt states her energy has been up and down. Pt states she has phentermine but does not take it because it keeps her awake at night: dietitian positively reinforced this decision.   Surgery date: 02/03/2016 Surgery type: sleeve gastrectomy [facility]: 326 lbs on 01/09/2016, 327.4 on 01/19/2016 Weight today: 259 lbs   Weight change: 4.6 lbs  TANITA  BODY COMP RESULTS  01/19/16 02/17/16 03/31/16 05/05/16 06/29/2016 08/10/2016   BMI (kg/m^2) 49.8 46.3 43.2 41.1 40.1 39.4   Fat Mass (lbs) 180.8 164.2 147.8 137.4 129 127.2   Fat Free Mass (lbs) 146.6 140.4 136.0 132.6 134.6 131.8   Total Body Water (lbs) 111.4 105.8 101.8 99.0 100 97.8    Preferred Learning Style:   No preference indicated   Learning Readiness:   Ready  24-hr recall: B (AM): protein shake with half a cup of frozen strawberries Snk (AM): m and ms L (PM): flatbread sub Snk (PM): popcorn D (PM):  baked chicken and green beans or salads  Snk (PM): lifesavers   Fluid intake: crystal light 20 ounces and 11 ounces from protein shake about 31 fluid ounces: juice  Estimated total protein intake: 60  Medications: see list  Supplementation:not taking everyday: taking liquid calcium, vitamin B12 liquid, vitamin d 2956250000 iu, bariatric multivitamin  Using straws: YES: 3 times Drinking while eating: NO Hair loss: a little bit Carbonated beverages: YES: gingerale N/V/D/C: gets nausea every once in a while with greasy foods, NO, NO, [id number]:constipation often taking fiber supplement  Dumping syndrome: None recently   Recent physical activity:  100  jumping jacks, walking 3 times a week 1 mile walk   Progress Towards Goal(s):  In progress.  Handouts given during visit include:  none   Nutritional Diagnosis:  [place]-3.3 Overweight/obesity related to past poor dietary habits and physical inactivity as evidenced by patient w/ recent sleeve gastrectomy surgery following dietary guidelines for continued weight loss.    Intervention:  Nutrition education/diet advancement. Goals: -Aim for 64 fluid ounces every day: will help with your constipation -Do not take your calcium at the same time as your multivitamin -Avoid corn -For Snacks:  -1 piece of fruit Fruit  -Handful of Vegetables: carrot sticks, celery, cherry tomatoes, cucumbers  -1 small handful of nuts  -Protein shake  -one small bag of smart pop popcorn  -Quest Protein chips   -cheese stick  -1 slice of lunch meat with 1 slice of cheese roll up  -Be sure to bring water with you to the gym  -Do not weigh yourself more than once a week  -Try sugar free hard candy when your on your period  -Try out some black beans with plain greek yogurt and some shredded cheese  -Try to eat beans about 3 days a week -Pull the skin off your chicken wings every time -Stick with the clearer salad dressings: stir away from ranch  -Take bites the size of a dime and take your time: put your fork down in between bite  -Chew, Chew, Chew -Do not eat deep fried food  Teaching Method Utilized:  Visual Auditory Hands on  Barriers to learning/adherence to  lifestyle change: none  Demonstrated degree of understanding via:  Teach Back   Monitoring/Evaluation:  Dietary intake, exercise, and body weight. Follow up in 1 month

## 2016-08-24 ENCOUNTER — Ambulatory Visit (HOSPITAL_COMMUNITY)
Admission: RE | Admit: 2016-08-24 | Discharge: 2016-08-24 | Disposition: A | Discharge status: Home / Self Care | Payer: BLUE CROSS/BLUE SHIELD | Source: Ambulatory Visit | Attending: General Surgery | Admitting: General Surgery

## 2016-08-24 DIAGNOSIS — K449 Diaphragmatic hernia without obstruction or gangrene: Secondary | ICD-10-CM | POA: Insufficient documentation

## 2016-08-24 DIAGNOSIS — K219 Gastro-esophageal reflux disease without esophagitis: Secondary | ICD-10-CM | POA: Diagnosis not present

## 2016-09-09 ENCOUNTER — Encounter: Payer: Self-pay | Admitting: Skilled Nursing Facility1

## 2016-09-09 ENCOUNTER — Encounter: Payer: BLUE CROSS/BLUE SHIELD | Attending: General Surgery | Admitting: Skilled Nursing Facility1

## 2016-09-09 DIAGNOSIS — E119 Type 2 diabetes mellitus without complications: Secondary | ICD-10-CM

## 2016-09-09 NOTE — Patient Instructions (Addendum)
-  Try kefir in the yogurt aisle   -Aim for 60 grams of protein every day  -Increase your fluid intake to take in at least 64 fluid ounces   -Avoid drinking Gatorade   -Get back into the habit of having a protein source every time you eat  -Find a protein shake with 15 grams of protein or more and 5 grams of carbohydrate or less that you like  -Set some alarms in your phone to remind you to eat and drink   -Walking is still okay   -Be sure to have a day or 2 in the week you are just walking and not doing something intense

## 2016-09-09 NOTE — Progress Notes (Signed)
Follow-up visit:  3 Months Post-Operative Sleeve Gastrectomy Surgery  Medical Nutrition Therapy:  Appt start time: 1000 end time:  1020  Primary concerns today: Post-operative Bariatric Surgery Nutrition Management.  Pt states her wt has not been stable with increases and decreases. Pt states she has been making some changes in the last week.  Pt states she was dehydrated. Pt states her energy has been up and down. Pt states she has phentermine but does not take it because it keeps her awake at night: dietitian positively reinforced this decision.    Pt arrives still not meeting her fluid and protein needs. Pt states she is currently taking antibiotics for abscesses. Pt states she is on a fruit and salad kick. Pt states some days she feels very weak taking 2-3 days to get caught back up. Pt states alkaline water has really helped her with reflux and it tastes good. Pt states she does not check her blood sugars and she has an endocrinologist appointment this coming Monday. Pt states she will also make an appointment with her gynecologist for birth control medication changes. Pt states she does not have any issues with tolerating any types of foods. Pt states due to recent removal of abscesses she has not been as physically active as she usually is.    Surgery date: 02/03/2016 Surgery type: sleeve gastrectomy [facility] lbs on 01/09/2016, 327.4 on 01/19/2016 Weight today: 252 lbs   Weight change: 7 lbs  TANITA  BODY COMP RESULTS  01/19/16 02/17/16 03/31/16 05/05/16 06/29/2016 08/10/2016 09/09/2016   BMI (kg/m^2) 49.8 46.3 43.2 41.1 40.1 39.4 38.3   Fat Mass (lbs) 180.8 164.2 147.8 137.4 129 127.2 125.8   Fat Free Mass (lbs) 146.6 140.4 136.0 132.6 134.6 131.8 126.2   Total Body Water (lbs) 111.4 105.8 101.8 99.0 100 97.8 93.4    Preferred Learning Style:   No preference indicated   Learning Readiness:   Ready  24-hr recall: B (AM): pack of cookies/crackers Snk (AM):  strawberries L (PM): salad and strawberries Snk (PM): popcorn D (PM):  salad Snk (PM):  Fluid intake: gatorade: 50.7 ounces of just water Estimated total protein intake: 60  Medications: see list  Supplementation: calcium, vitamin d [id number] iu once a week, bariatric multivitamin  Using straws: YES: 3 times Drinking while eating: NO Hair loss: unknown Carbonated beverages: YES: gingerale N/V/D/C: Diarrhea with greasy foods,  [id number]:constipation often taking fiber supplement  Dumping syndrome: None  Recent physical activity:  100 jumping jacks, walking 3 times a week 1 mile walk: due to abscesses has not been in the gym  Progress Towards Goal(s):  In progress.  Handouts given during visit include:  none   Nutritional Diagnosis:  [place]-3.3 Overweight/obesity related to past poor dietary habits and physical inactivity as evidenced by patient w/ recent sleeve gastrectomy surgery following dietary guidelines for continued weight loss.    Intervention:  Nutrition education/diet advancement. Goals: -Try kefir in the yogurt aisle  -Aim for 60 grams of protein every day -Increase your fluid intake to take in at least 64 fluid ounces  -Avoid drinking Gatorade  -Get back into the habit of having a protein source every time you eat -Find a protein shake with 15 grams of protein or more and 5 grams of carbohydrate or less that you like -Set some alarms in your phone to remind you to eat and drink  -Walking is still okay  -Be sure to have a day or 2 in the week  you are just walking and not doing something intense    Teaching Method Utilized:  Visual Auditory Hands on  Barriers to learning/adherence to lifestyle change: perceived time hindrance  Demonstrated degree of understanding via:  Teach Back   Monitoring/Evaluation:  Dietary intake, exercise, and body weight. Follow up in 1 month.

## 2016-10-19 ENCOUNTER — Encounter: Payer: BLUE CROSS/BLUE SHIELD | Attending: General Surgery | Admitting: Skilled Nursing Facility1

## 2016-10-19 ENCOUNTER — Encounter: Payer: Self-pay | Admitting: Skilled Nursing Facility1

## 2016-10-19 DIAGNOSIS — E119 Type 2 diabetes mellitus without complications: Secondary | ICD-10-CM

## 2016-10-19 NOTE — Patient Instructions (Addendum)
-  Check your blood sugar 2 times a day: one fasting and one latter in the day  -Use the baritastic app for fluid too   -Options for energy sources: kefir, lactaid milk, beans, corn, peas  -try kefir yogurt   -For breakfast: 1 protein shake and 1 cutie   -Focus on protein for snacks   -Low blood sugar under 70   -be safe working out have your protein shake with strawberries about an hour before

## 2016-10-19 NOTE — Progress Notes (Signed)
  Follow-up visit:  3 Months Post-Operative Sleeve Gastrectomy Surgery  Medical Nutrition Therapy:  Appt start time: 1000 end time:  1020  Primary concerns today: Post-operative Bariatric Surgery Nutrition Management.  Pt states she is now taking once weekly trulicity. Pt states she randomly feels sweaty 2-3 times a week. Dizzy and nauseous. Pt states her birth control was changed. Pt was taught how to test her blood sugar:Tested blood sugar got 74. Pt was advised to get some milk immediately after the appointment. Pt has not been meeting her protein or fluid needs since surgery and has been skipping meals due to a perceived time constraint. Pt will be done with school this week.    Surgery date: 02/03/2016 Surgery type: sleeve gastrectomy Start weight at [facility]: 326 lbs on 01/09/2016, 327.4 on 01/19/2016 Weight today: 244 lbs  Weight change: 8 lbs  TANITA  BODY COMP RESULTS  01/19/16 02/17/16 03/31/16 05/05/16 06/29/2016 08/10/2016 09/09/2016 10/19/2016   BMI (kg/m^2) 49.8 46.3 43.2 41.1 40.1 39.4 38.3 37.1   Fat Mass (lbs) 180.8 164.2 147.8 137.4 129 127.2 125.8 119   Fat Free Mass (lbs) 146.6 140.4 136.0 132.6 134.6 131.8 126.2 125   Total Body Water (lbs) 111.4 105.8 101.8 99.0 100 97.8 93.4 92.4    Preferred Learning Style:   No preference indicated   Learning Readiness:   Ready  24-hr recall: B (AM): none----2 egg whites (does not like the yolk) Snk (AM):  L (PM): protein shake Snk (PM): sugar free jello belvita crackers  D (PM):  2 Chicken wings Snk (PM):  Fluid intake: water 32 ounces, diet cranberry juice, lactaid 2% or 1% Estimated total protein intake: 60  Medications: see list  Supplementation: calcium, vitamin d 40981 iu once a week, bariatric multivitamin  Using straws: YES: 3 times Drinking while eating: NO Hair loss: unknown Carbonated beverages: YES: gingerale N/V/D/C: Diarrhea with greasy foods,  [id number] often taking fiber supplement/milk of  magnesia  Dumping syndrome: None  Recent physical activity:  100 jumping jacks, walking 3 times a week 1 mile walk: due to abscesses has not been in the gym  Progress Towards Goal(s):  In progress.  Handouts given during visit include:  none   Nutritional Diagnosis:  [place]-3.3 Overweight/obesity related to past poor dietary habits and physical inactivity as evidenced by patient w/ recent sleeve gastrectomy surgery following dietary guidelines for continued weight loss.    Intervention:  Nutrition education/diet advancement. Goals:  -Check your blood sugar 2 times a day: one fasting and one latter in the day -Use the baritastic app for fluid too  -Options for energy sources: kefir, lactaid milk, beans, corn, peas -try kefir yogurt  -For breakfast: 1 protein shake and 1 cutie  -Focus on protein for snacks  -Low blood sugar under 70  -be safe working out have your protein shake with strawberries about an hour before   Teaching Method Utilized:  Visual Auditory Hands on  Barriers to learning/adherence to lifestyle change: perceived time hindrance  Demonstrated degree of understanding via:  Teach Back   Monitoring/Evaluation:  Dietary intake, exercise, and body weight. Follow up in 1 month.

## 2017-02-01 ENCOUNTER — Telehealth: Payer: Self-pay | Admitting: General Practice

## 2017-02-01 NOTE — Telephone Encounter (Signed)
Patient called in stating records were sent in about a month ago from [employer] [facility]. Patient has been waiting for a call to get scheduled as new patient. Please call patient and advise. OK to leave message.

## 2017-09-20 ENCOUNTER — Other Ambulatory Visit: Payer: Self-pay

## 2017-09-20 ENCOUNTER — Encounter (HOSPITAL_COMMUNITY): Payer: Self-pay | Admitting: Emergency Medicine

## 2017-09-20 ENCOUNTER — Ambulatory Visit (HOSPITAL_COMMUNITY)
Admission: EM | Admit: 2017-09-20 | Discharge: 2017-09-20 | Disposition: A | Discharge status: Home / Self Care | Payer: BLUE CROSS/BLUE SHIELD | Attending: Family Medicine | Admitting: Family Medicine

## 2017-09-20 DIAGNOSIS — L0291 Cutaneous abscess, unspecified: Secondary | ICD-10-CM | POA: Diagnosis not present

## 2017-09-20 DIAGNOSIS — L732 Hidradenitis suppurativa: Secondary | ICD-10-CM

## 2017-09-20 MED ORDER — CLINDAMYCIN HCL 150 MG PO CAPS: 150.0000 mg | ORAL_CAPSULE | Freq: Four times a day (QID) | ORAL | 0 refills | Status: DC

## 2017-09-20 NOTE — ED Provider Notes (Addendum)
[facility]   [id number] 09/20/17 Arrival Time: 1500   SUBJECTIVE:  [name] Gonzales is a 27 y.o. female who presents to the urgent care with complaint of abscess under right breast. Patient has had hidradenitis suppurativa with multiple incision and drainages over the years underneath each breast.  At this time of the abscess started about 10 days ago and is not improved with doxycycline.  Her general surgeon is out of town.  She was instructed by the surgeon's office to come here for an I&D.  Past Medical History:  Diagnosis Date  . Bacterial vaginosis   . Bronchitis   . Chlamydia   . Diabetes mellitus   . GERD (gastroesophageal reflux disease)   . Gonorrhea   . Hidradenitis suppurativa   . Neuromuscular disorder (HCC)    neuropathy  . Paresthesia    hands, feet  . Trichomoniasis   . UTI (urinary tract infection)    Family History  Problem Relation Age of Onset  . Stroke Mother        2010  . Heart attack Mother        2010  . Hypertension Mother   . Lupus Sister    Social History   Socioeconomic History  . Marital status: Single    Spouse name: Not on file  . Number of children: Not on file  . Years of education: Not on file  . Highest education level: Not on file  Occupational History  . Not on file  Social Needs  . Financial resource strain: Not on file  . Food insecurity:    Worry: Not on file    Inability: Not on file  . Transportation needs:    Medical: Not on file    Non-medical: Not on file  Tobacco Use  . Smoking status: Current Some Day Smoker    Packs/day: 0.25    Types: Cigarettes    Last attempt to quit: 01/19/2014    Years since quitting: 3.6  . Smokeless tobacco: Never Used  Substance and Sexual Activity  . Alcohol use: Yes    Alcohol/week: 0.6 oz    Types: 1 Standard drinks or equivalent per week  . Drug use: No  . Sexual activity: Yes  Lifestyle  . Physical activity:    Days per week: Not on file    Minutes per  session: Not on file  . Stress: Not on file  Relationships  . Social connections:    Talks on phone: Not on file    Gets together: Not on file    Attends religious service: Not on file    Active member of club or organization: Not on file    Attends meetings of clubs or organizations: Not on file    Relationship status: Not on file  . Intimate partner violence:    Fear of current or ex partner: Not on file    Emotionally abused: Not on file    Physically abused: Not on file    Forced sexual activity: Not on file  Other Topics Concern  . Not on file  Social History Narrative  . Not on file   Current Meds  Medication Sig  . Dulaglutide (TRULICITY) 1.5 MG/0.5ML SOPN INJECT 1.5 MG SUBCUTANEOUSLY ONCE A WEEK  . [DISCONTINUED] doxycycline (DORYX) 150 MG EC tablet Take 150 mg by mouth 2 (two) times daily.   Allergies  Allergen Reactions  . Penicillins Hives and Swelling    Has patient had a PCN reaction causing  immediate rash, facial/tongue/throat swelling, SOB or lightheadedness with hypotension: Yes Has patient had a PCN reaction causing severe rash involving mucus membranes or skin necrosis: No Has patient had a PCN reaction that required hospitalization: Yes Has patient had a PCN reaction occurring within the last 10 years: No       ROS: As per HPI, remainder of ROS negative.   OBJECTIVE:   Vitals:   09/20/17 1524  BP: 119/76  Pulse: 77  Temp: 98.5 F (36.9 C)  TempSrc: Oral  SpO2: 99%     General appearance: alert; no distress Eyes: PERRL; EOMI; conjunctiva normal HENT: normocephalic; atraumatic;  oral mucosa normal Neck: supple Back: no CVA tenderness Extremities: no cyanosis or edema; symmetrical with no gross deformities Skin: warm and dry; small draining area under the right breast was noted and there was subcutaneous induration diffusely. Neurologic: normal gait; grossly normal Psychological: alert and cooperative; normal mood and  affect      Labs:  Results for orders placed or performed during the hospital encounter of 02/03/16  Pregnancy, urine STAT morning of surgery  Result Value Ref Range   Preg Test, Ur NEGATIVE NEGATIVE  Glucose, capillary  Result Value Ref Range   Glucose-Capillary 138 (H) 65 - 99 mg/dL   Comment 1 Notify RN    Comment 2 Document in Chart   CBC  Result Value Ref Range   WBC 13.4 (H) 4.0 - 10.5 K/uL   RBC 5.20 (H) 3.87 - 5.11 MIL/uL   Hemoglobin 12.7 12.0 - 15.0 g/dL   HCT 40.9 81.1 - 91.4 %   MCV 76.2 (L) 78.0 - 100.0 fL   MCH 24.4 (L) 26.0 - 34.0 pg   MCHC 32.1 30.0 - 36.0 g/dL   RDW 78.2 95.6 - 21.3 %   Platelets 442 (H) 150 - 400 K/uL  Creatinine, serum  Result Value Ref Range   Creatinine, Ser 0.50 0.44 - 1.00 mg/dL   GFR calc non Af Amer >60 >60 mL/min   GFR calc Af Amer >60 >60 mL/min  CBC WITH DIFFERENTIAL  Result Value Ref Range   WBC 10.2 4.0 - 10.5 K/uL   RBC 5.36 (H) 3.87 - 5.11 MIL/uL   Hemoglobin 13.2 12.0 - 15.0 g/dL   HCT 08.6 57.8 - 46.9 %   MCV 74.8 (L) 78.0 - 100.0 fL   MCH 24.6 (L) 26.0 - 34.0 pg   MCHC 32.9 30.0 - 36.0 g/dL   RDW 62.9 52.8 - 41.3 %   Platelets 413 (H) 150 - 400 K/uL   Neutrophils Relative % 78 %   Neutro Abs 7.9 (H) 1.7 - 7.7 K/uL   Lymphocytes Relative 15 %   Lymphs Abs 1.5 0.7 - 4.0 K/uL   Monocytes Relative 7 %   Monocytes Absolute 0.7 0.1 - 1.0 K/uL   Eosinophils Relative 0 %   Eosinophils Absolute 0.0 0.0 - 0.7 K/uL   Basophils Relative 0 %   Basophils Absolute 0.0 0.0 - 0.1 K/uL  Comprehensive metabolic panel  Result Value Ref Range   Sodium 136 135 - 145 mmol/L   Potassium 3.8 3.5 - 5.1 mmol/L   Chloride 100 (L) 101 - 111 mmol/L   CO2 26 22 - 32 mmol/L   Glucose, Bld 90 65 - 99 mg/dL   BUN 8 6 - 20 mg/dL   Creatinine, Ser 2.44 0.44 - 1.00 mg/dL   Calcium 9.0 8.9 - 01.0 mg/dL   Total Protein 8.5 (H) 6.5 - 8.1 g/dL  Albumin 4.7 3.5 - 5.0 g/dL   AST 29 15 - 41 U/L   ALT 38 14 - 54 U/L   Alkaline Phosphatase 42  38 - 126 U/L   Total Bilirubin 0.6 0.3 - 1.2 mg/dL   GFR calc non Af Amer >60 >60 mL/min   GFR calc Af Amer >60 >60 mL/min   Anion gap 10 5 - 15    Labs Reviewed - No data to display  No results found.     ASSESSMENT & PLAN:  1. Abscess   2. Hidradenitis suppurativa     Meds ordered this encounter  Medications  . clindamycin (CLEOCIN) 150 MG capsule    Sig: Take 1 capsule (150 mg total) by mouth every 6 (six) hours.    Dispense:  28 capsule    Refill:  0    Reviewed expectations re: course of current medical issues. Questions answered. Outlined signs and symptoms indicating need for more acute intervention. Patient verbalized understanding. After Visit Summary given.    Procedures: After Betadine prep and 2% Xylocaine with epi local anesthesia, the fistulous tract was opened with a 1 cm incision and then probed with curved hemostat.  The area was then packed with 1/4 inch iodoform gauze.  Sterile dressing was applied      Elvina Sidle, MD 09/20/17 1557    Elvina Sidle, MD 09/20/17 1558

## 2017-09-20 NOTE — Discharge Instructions (Addendum)
Follow up with your surgeon

## 2017-09-20 NOTE — ED Triage Notes (Signed)
C/o abscess under right BR

## 2017-11-10 IMAGING — DX DG CHEST 2V
2 series · 2 of 2 positions shown · non-contrast
Comparison: 03/17/2015 and earlier.

CLINICAL DATA: 24-year-old female with morbid obesity undergoing
evaluation for bariatric surgery. Initial encounter.

EXAM:
CHEST  2 VIEW

[chest pa]
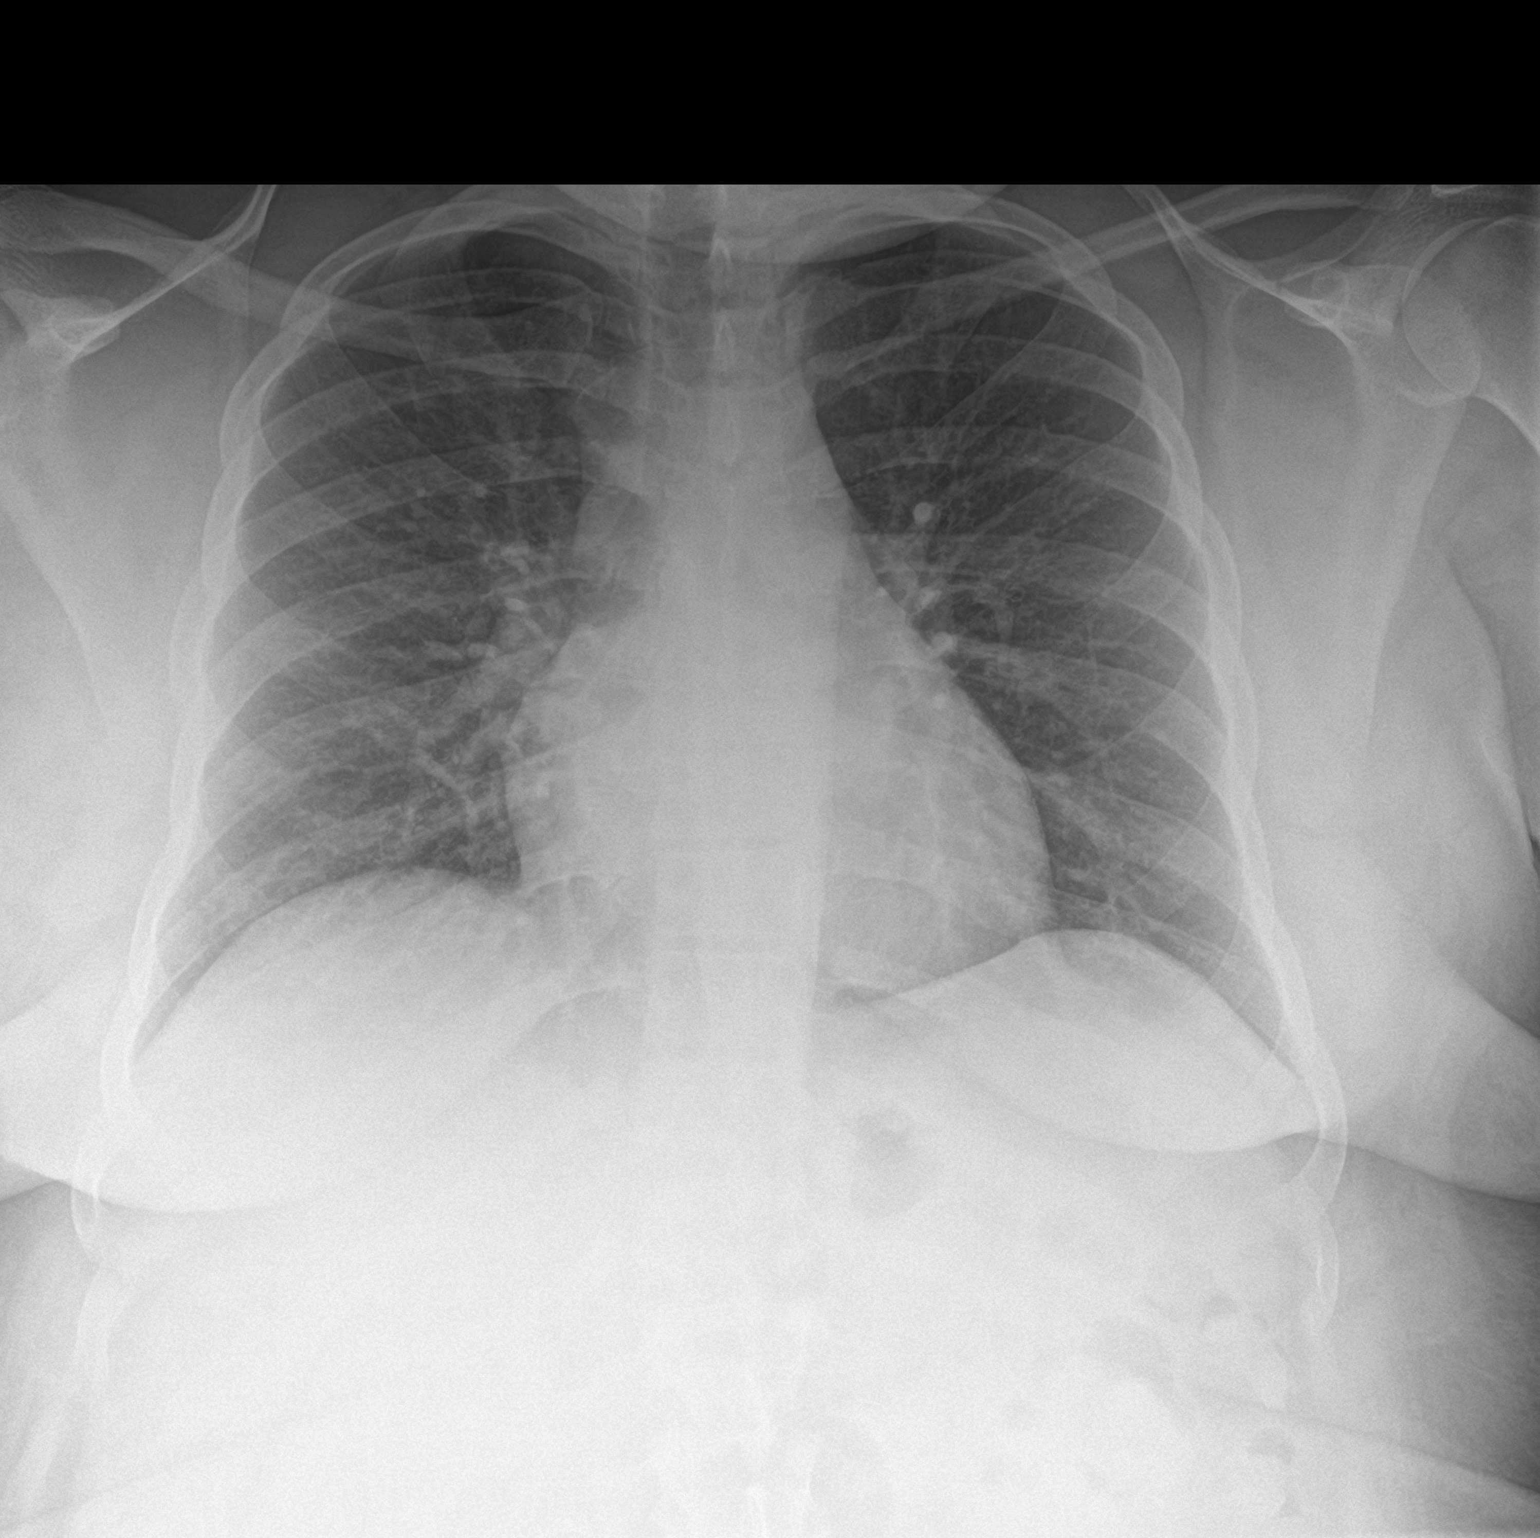

[chest lat]
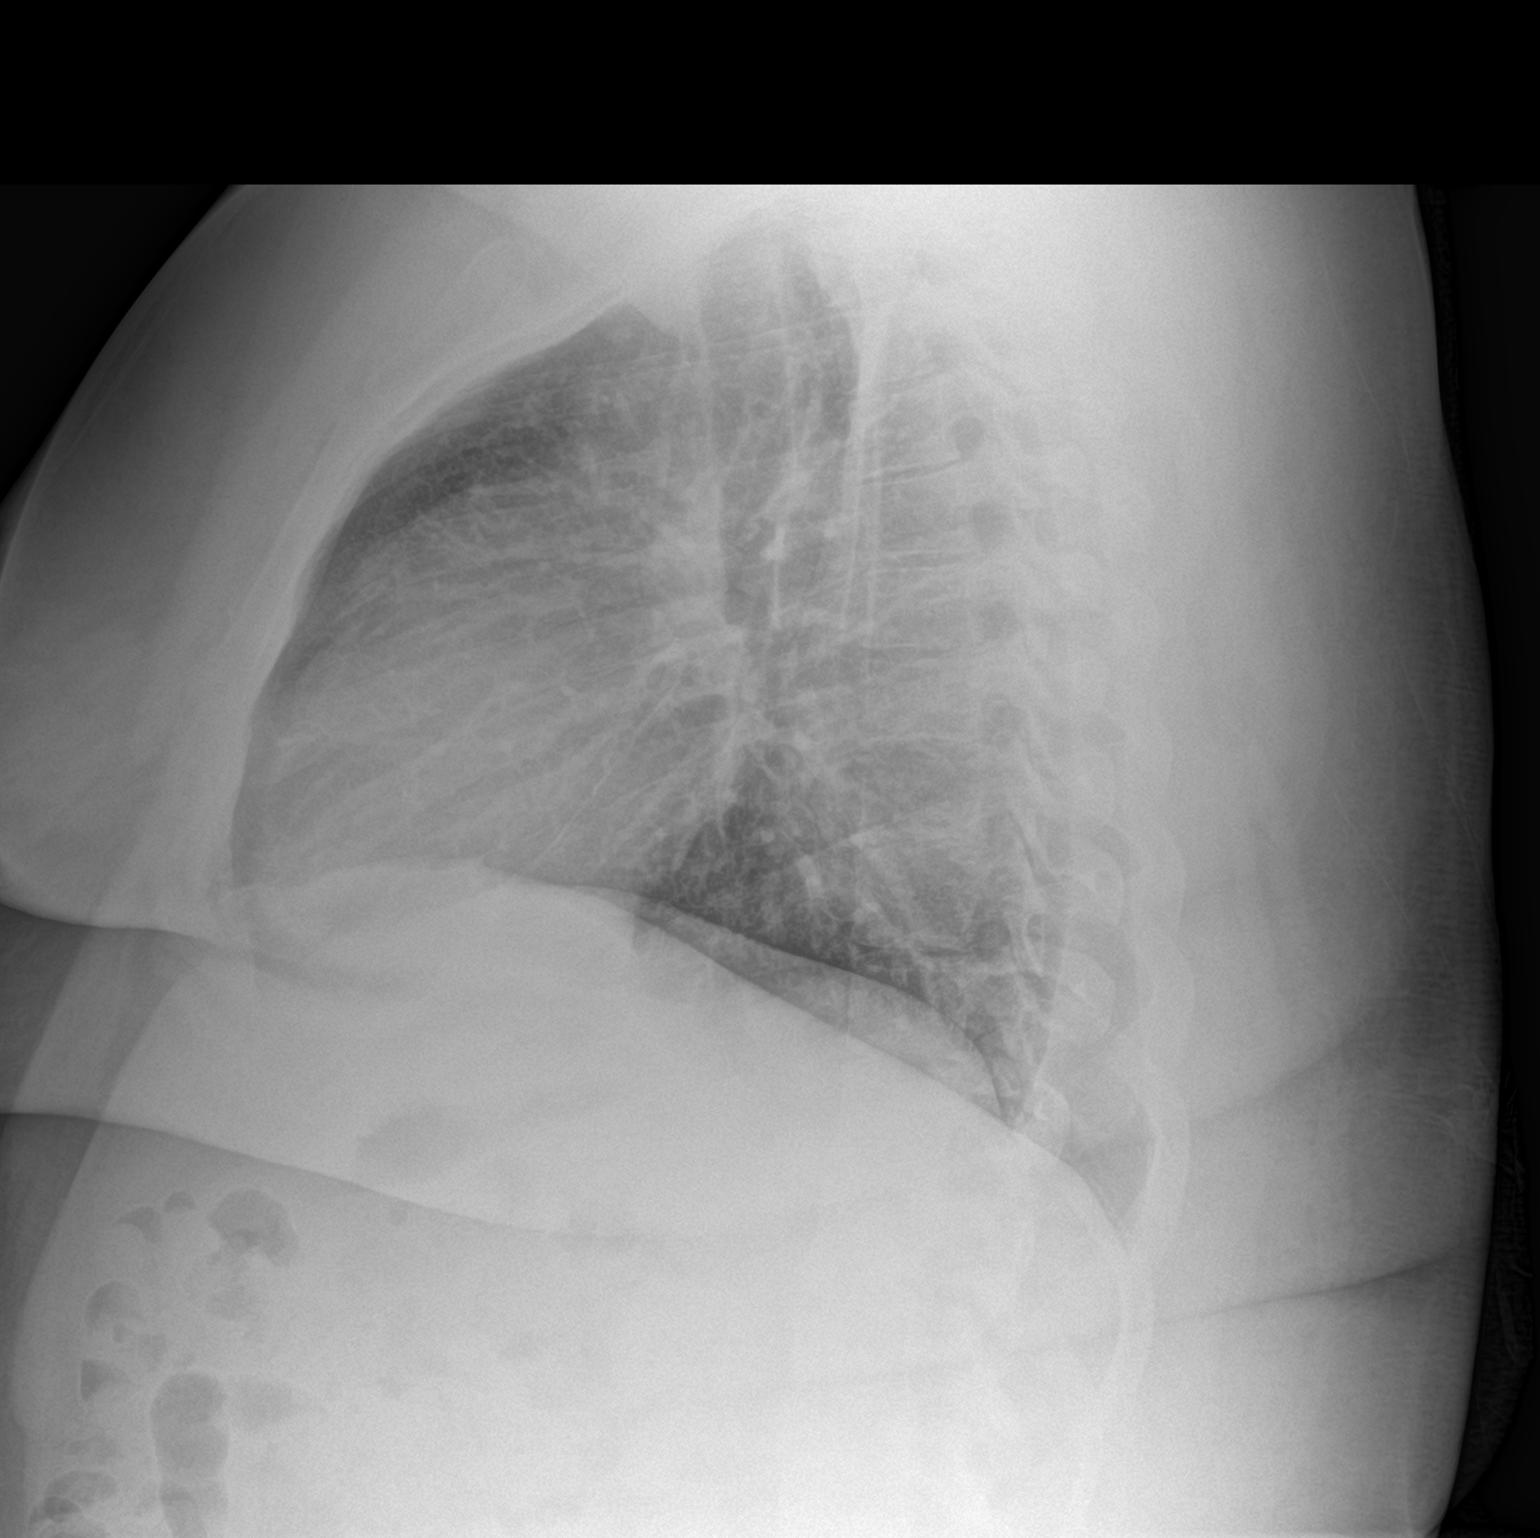

[2 of 2 positions shown; findings below may reference images not displayed]

FINDINGS: Improved lung volumes, normal. Mediastinal contours are within
normal limits. Visualized tracheal air column is within normal
limits. The lungs are clear. No pneumothorax or pleural effusion. No
osseous abnormality identified. Negative visible bowel gas pattern.
IMPRESSION: Negative, no acute cardiopulmonary abnormality.

## 2017-12-01 ENCOUNTER — Emergency Department (HOSPITAL_COMMUNITY)
Admission: EM | Admit: 2017-12-01 | Discharge: 2017-12-01 | Disposition: A | Discharge status: Home / Self Care | Payer: BLUE CROSS/BLUE SHIELD | Attending: Emergency Medicine | Admitting: Emergency Medicine

## 2017-12-01 ENCOUNTER — Emergency Department (HOSPITAL_COMMUNITY): Payer: BLUE CROSS/BLUE SHIELD

## 2017-12-01 DIAGNOSIS — F17210 Nicotine dependence, cigarettes, uncomplicated: Secondary | ICD-10-CM | POA: Insufficient documentation

## 2017-12-01 DIAGNOSIS — R0789 Other chest pain: Secondary | ICD-10-CM | POA: Insufficient documentation

## 2017-12-01 DIAGNOSIS — E119 Type 2 diabetes mellitus without complications: Secondary | ICD-10-CM | POA: Insufficient documentation

## 2017-12-01 DIAGNOSIS — Z79899 Other long term (current) drug therapy: Secondary | ICD-10-CM | POA: Insufficient documentation

## 2017-12-01 DIAGNOSIS — Z7984 Long term (current) use of oral hypoglycemic drugs: Secondary | ICD-10-CM | POA: Diagnosis not present

## 2017-12-01 DIAGNOSIS — R079 Chest pain, unspecified: Secondary | ICD-10-CM | POA: Diagnosis present

## 2017-12-01 LAB — BASIC METABOLIC PANEL
Anion gap: 8 (ref 5–15)
BUN: 8 mg/dL (ref 6–20)
CHLORIDE: 110 mmol/L (ref 101–111)
CO2: 25 mmol/L (ref 22–32)
CREATININE: 0.5 mg/dL (ref 0.44–1.00)
Calcium: 8.9 mg/dL (ref 8.9–10.3)
GFR calc non Af Amer: >60 mL/min (ref >60)
Glucose, Bld: 82 mg/dL (ref 65–99)
Potassium: 3.5 mmol/L (ref 3.5–5.1)
Sodium: 143 mmol/L (ref 135–145)

## 2017-12-01 LAB — CBC
HEMATOCRIT: 37.8 % (ref 36.0–46.0)
HEMOGLOBIN: 12.4 g/dL (ref 12.0–15.0)
MCH: 25.9 pg — AB (ref 26.0–34.0)
MCHC: 32.8 g/dL (ref 30.0–36.0)
MCV: 79.1 fL (ref 78.0–100.0)
Platelets: 330 10*3/uL (ref 150–400)
RBC: 4.78 MIL/uL (ref 3.87–5.11)
RDW: 14.8 % (ref 11.5–15.5)
WBC: 6.6 10*3/uL (ref 4.0–10.5)

## 2017-12-01 LAB — I-STAT TROPONIN, ED: Troponin i, poc: 0 ng/mL (ref 0.00–0.08)

## 2017-12-01 LAB — I-STAT BETA HCG BLOOD, ED (MC, WL, AP ONLY): I-stat hCG, quantitative: <5 m[IU]/mL (ref <5)

## 2017-12-01 LAB — D-DIMER, QUANTITATIVE (NOT AT ARMC): D DIMER QUANT: 0.3 ug{FEU}/mL (ref 0.00–0.50)

## 2017-12-01 MED ORDER — PREDNISONE 20 MG PO TABS
60.0000 mg | ORAL_TABLET | Freq: Once | ORAL | Status: AC
  Administered 2017-12-01: 60 mg via ORAL
  Filled 2017-12-01: qty 3

## 2017-12-01 MED ORDER — ALBUTEROL SULFATE HFA 108 (90 BASE) MCG/ACT IN AERS: 2.0000 | INHALATION_SPRAY | RESPIRATORY_TRACT | 0 refills | Status: AC | PRN

## 2017-12-01 MED ORDER — IPRATROPIUM-ALBUTEROL 0.5-2.5 (3) MG/3ML IN SOLN
3.0000 mL | Freq: Once | RESPIRATORY_TRACT | Status: AC
  Administered 2017-12-01: 3 mL via RESPIRATORY_TRACT
  Filled 2017-12-01: qty 3

## 2017-12-01 MED ORDER — PREDNISONE 50 MG PO TABS: 50.0000 mg | ORAL_TABLET | Freq: Every day | ORAL | 0 refills | Status: DC

## 2017-12-01 NOTE — ED Triage Notes (Signed)
see paper chart

## 2017-12-01 NOTE — ED Provider Notes (Signed)
[facility]-EMERGENCY DEPT Provider Note   CSN: [id number] Arrival date & time: 12/01/17  0155     History   Chief Complaint Chest pain.   HPI [name] Gonzales is a 27 y.o. female.  The history is provided by the patient.  She has history of diabetes and comes in with 3-day history of chest pain.  As she describes a throbbing, achy pain in her chest which has been as severe as 8/10, currently 3/10.  It was coming and going until yesterday evening when it became more steady.  It is worse with movement and worse with deep breathing.  There is associated dyspnea and mild nausea but no diaphoresis.  She has not vomited.  She denies coughing.  She denies fever or chills.  She has not had anything to treat it.  She has not had similar symptoms before.  She is a non-smoker and denies history of hypertension or hyperlipidemia.  There is a family history of premature coronary atherosclerosis (mother had myocardial infarction in her late 6430s).  She denies history of recent trauma or surgery.  She is on oral contraceptives.  Past Medical History:  Diagnosis Date  . Bacterial vaginosis   . Bronchitis   . Chlamydia   . Diabetes mellitus   . GERD (gastroesophageal reflux disease)   . Gonorrhea   . Hidradenitis suppurativa   . Neuromuscular disorder (HCC)    neuropathy  . Paresthesia    hands, feet  . Trichomoniasis   . UTI (urinary tract infection)     Patient Active Problem List   Diagnosis Date Noted  . Morbid obesity (HCC) 02/03/2016  . Hidradenitis suppurativa 07/09/2015  . Screening examination for venereal disease 07/09/2015  . Pilonidal disease 10/18/2013    Past Surgical History:  Procedure Laterality Date  . LAPAROSCOPIC GASTRIC SLEEVE RESECTION N/A 02/03/2016   Procedure: LAPAROSCOPIC GASTRIC SLEEVE RESECTION WITH UPPER ENDO;  Surgeon: De BlanchLuke Aaron Kinsinger, MD;  Location: [facility];  Service: General;  Laterality: N/A;  . OOPHORECTOMY  11/2009   left  .  SALPINGOOPHORECTOMY    . TONSILLECTOMY  2002     OB History   None      Home Medications    Prior to Admission medications   Medication Sig Start Date End Date Taking? Authorizing Provider  Dulaglutide (TRULICITY) 1.5 MG/0.5ML SOPN INJECT 1.5 MG SUBCUTANEOUSLY ONCE A WEEK 08/25/17  Yes [provider]  esomeprazole (NEXIUM) 20 MG capsule Take 20 mg by mouth at bedtime as needed (Heart burn).    Yes [provider]  gabapentin (NEURONTIN) 100 MG capsule Take 100 mg by mouth at bedtime.    Yes [provider]  levonorgestrel-ethinyl estradiol (SEASONALE,INTROVALE,JOLESSA) 0.15-0.03 MG tablet Take 1 tablet by mouth daily.    Yes [provider]  pantoprazole (PROTONIX) 40 MG tablet Take 40 mg by mouth daily.   Yes [provider]  spironolactone (ALDACTONE) 25 MG tablet Take 25 mg by mouth daily.   Yes [provider]  Vitamin D, Ergocalciferol, (DRISDOL) 50000 units CAPS capsule Take 50,000 Units by mouth every 7 (seven) days.   Yes [provider]  clindamycin (CLEOCIN) 150 MG capsule Take 1 capsule (150 mg total) by mouth every 6 (six) hours. Patient not taking: Reported on 12/01/2017 09/20/17   Elvina SidleLauenstein, Kurt, MD  oxyCODONE-acetaminophen (PERCOCET/ROXICET) 5-325 MG tablet Take 1 tablet by mouth every 4 (four) hours as needed for severe pain. Patient not taking: Reported on 12/01/2017 04/30/16   Jim DesanctisNadeau,  Satira Sark, PA-C    Family History Family History  Problem Relation Age of Onset  . Stroke Mother        2010  . Heart attack Mother        2010  . Hypertension Mother   . Lupus Sister     Social History Social History   Tobacco Use  . Smoking status: Current Some Day Smoker    Packs/day: 0.25    Types: Cigarettes    Last attempt to quit: 01/19/2014    Years since quitting: 3.8  . Smokeless tobacco: Never Used  Substance Use Topics  . Alcohol use: Yes    Alcohol/week: 0.6 oz    Types: 1 Standard drinks or  equivalent per week  . Drug use: No     Allergies   Penicillins   Review of Systems Review of Systems  All other systems reviewed and are negative.    Physical Exam Updated Vital Signs BP 126/82 (BP Location: Left Arm)   Pulse 77   Resp (!) 23   LMP 10/26/2017   SpO2 100%   Physical Exam  Nursing note and vitals reviewed.  27 year old female, resting comfortably and in no acute distress. Vital signs are significant for mild tachypnea. Oxygen saturation is 100%, which is normal. Head is normocephalic and atraumatic. PERRLA, EOMI. Oropharynx is clear. Neck is nontender and supple without adenopathy or JVD. Back is nontender and there is no CVA tenderness. Lungs are clear without rales, wheezes, or rhonchi.  Slightly prolonged exhalation phases present. Chest is mildly tender in the bilateral parasternal area. Heart has regular rate and rhythm without murmur. Abdomen is soft, flat, nontender without masses or hepatosplenomegaly and peristalsis is normoactive. Extremities have no cyanosis or edema, full range of motion is present. Skin is warm and dry without rash. Neurologic: Mental status is normal, cranial nerves are intact, there are no motor or sensory deficits.  ED Treatments / Results  Labs (all labs ordered are listed, but only abnormal results are displayed) Labs Reviewed  CBC - Abnormal; Notable for the following components:      Result Value   MCH 25.9 (*)    All other components within normal limits  BASIC METABOLIC PANEL  D-DIMER, QUANTITATIVE (NOT AT [facility])  I-STAT TROPONIN, ED  I-STAT BETA HCG BLOOD, ED ([facility], [facility], [facility] ONLY)    EKG EKG Interpretation  Date/Time:  Thursday December 01 2017 03:02:11 EDT Ventricular Rate:  83 PR Interval:    QRS Duration: 83 QT Interval:  367 QTC Calculation: 432 R Axis:   57 Text Interpretation:  Sinus rhythm Normal ECG When compared with ECG of EARLIER SAME DATE No significant change was found Confirmed by Dione Booze  ([id number]) on 12/01/2017 3:05:54 AM   Radiology Dg Chest 2 View  Result Date: 12/01/2017 CLINICAL DATA:  Cough and congestion for 48 hours. EXAM: CHEST - 2 VIEW COMPARISON:  12/12/2015 FINDINGS: The cardiomediastinal contours are normal. The lungs are clear. Pulmonary vasculature is normal. No consolidation, pleural effusion, or pneumothorax. No acute osseous abnormalities are seen. IMPRESSION: Negative radiographs of the chest. Electronically Signed   By: Rubye Oaks M.D.   On: 12/01/2017 05:33    Procedures Procedures  Medications Ordered in ED Medications  predniSONE (DELTASONE) tablet 60 mg (has no administration in time range)  ipratropium-albuterol (DUONEB) 0.5-2.5 (3) MG/3ML nebulizer solution 3 mL (3 mLs Nebulization Given 12/01/17 6045)     Initial Impression / Assessment and Plan / ED Course  I have reviewed the triage vital signs and the nursing notes.  Pertinent labs & imaging results that were available during my care of the patient were reviewed by me and considered in my medical decision making (see chart for details).  Chest discomfort of uncertain cause.  ECG is normal, chest x-ray is normal, troponin normal.  Unfortunately, because of oral contraceptives use, unable to rule out pulmonary embolism.  D-dimer is ordered.  We will give therapeutic trial of albuterol with ipratropium.  Old records are reviewed, and she has no relevant past visits.  D-dimer is negative.  She feels significantly better after albuterol with ipratropium, so we will treat as possible acute bronchitis.  She is given an dose of prednisone and discharged with a short course of prednisone and an albuterol inhaler.  Return precautions discussed.  Final Clinical Impressions(s) / ED Diagnoses   Final diagnoses:  Atypical chest pain    ED Discharge Orders        Ordered    predniSONE (DELTASONE) 50 MG tablet  Daily     12/01/17 0726    albuterol (PROVENTIL HFA;VENTOLIN HFA) 108 (90 Base) MCG/ACT  inhaler  Every 4 hours PRN     12/01/17 0726       Dione Booze, MD 12/01/17 [phone]

## 2018-05-07 ENCOUNTER — Other Ambulatory Visit: Payer: Self-pay

## 2018-05-07 ENCOUNTER — Emergency Department (HOSPITAL_COMMUNITY)
Admission: EM | Admit: 2018-05-07 | Discharge: 2018-05-07 | Disposition: A | Discharge status: Home / Self Care | Payer: BLUE CROSS/BLUE SHIELD | Attending: Emergency Medicine | Admitting: Emergency Medicine

## 2018-05-07 ENCOUNTER — Encounter (HOSPITAL_COMMUNITY): Payer: Self-pay

## 2018-05-07 DIAGNOSIS — F17210 Nicotine dependence, cigarettes, uncomplicated: Secondary | ICD-10-CM | POA: Insufficient documentation

## 2018-05-07 DIAGNOSIS — E119 Type 2 diabetes mellitus without complications: Secondary | ICD-10-CM | POA: Insufficient documentation

## 2018-05-07 DIAGNOSIS — R42 Dizziness and giddiness: Secondary | ICD-10-CM | POA: Diagnosis present

## 2018-05-07 DIAGNOSIS — N3000 Acute cystitis without hematuria: Secondary | ICD-10-CM | POA: Diagnosis not present

## 2018-05-07 LAB — CBC
HCT: 42.3 % (ref 36.0–46.0)
Hemoglobin: 12.4 g/dL (ref 12.0–15.0)
MCH: 24.5 pg — ABNORMAL LOW (ref 26.0–34.0)
MCHC: 29.3 g/dL — AB (ref 30.0–36.0)
MCV: 83.6 fL (ref 80.0–100.0)
Platelets: 376 10*3/uL (ref 150–400)
RBC: 5.06 MIL/uL (ref 3.87–5.11)
RDW: 15.3 % (ref 11.5–15.5)
WBC: 6.9 10*3/uL (ref 4.0–10.5)
nRBC: 0 % (ref 0.0–0.2)

## 2018-05-07 LAB — URINALYSIS, ROUTINE W REFLEX MICROSCOPIC
BILIRUBIN URINE: NEGATIVE
Glucose, UA: NEGATIVE mg/dL
HGB URINE DIPSTICK: NEGATIVE
Ketones, ur: NEGATIVE mg/dL
NITRITE: NEGATIVE
Protein, ur: NEGATIVE mg/dL
SPECIFIC GRAVITY, URINE: 1.03 (ref 1.005–1.030)
WBC, UA: >50 WBC/hpf — ABNORMAL HIGH (ref 0–5)
pH: 5 (ref 5.0–8.0)

## 2018-05-07 LAB — COMPREHENSIVE METABOLIC PANEL
ALT: 16 U/L (ref 0–44)
ANION GAP: 7 (ref 5–15)
AST: 21 U/L (ref 15–41)
Albumin: 3.7 g/dL (ref 3.5–5.0)
Alkaline Phosphatase: 39 U/L (ref 38–126)
BILIRUBIN TOTAL: 0.5 mg/dL (ref 0.3–1.2)
BUN: 6 mg/dL (ref 6–20)
CO2: 21 mmol/L — ABNORMAL LOW (ref 22–32)
Calcium: 9 mg/dL (ref 8.9–10.3)
Chloride: 110 mmol/L (ref 98–111)
Creatinine, Ser: 0.6 mg/dL (ref 0.44–1.00)
GFR calc Af Amer: >60 mL/min (ref >60)
GFR calc non Af Amer: >60 mL/min (ref >60)
Glucose, Bld: 107 mg/dL — ABNORMAL HIGH (ref 70–99)
POTASSIUM: 3.4 mmol/L — AB (ref 3.5–5.1)
Sodium: 138 mmol/L (ref 135–145)
TOTAL PROTEIN: 6.4 g/dL — AB (ref 6.5–8.1)

## 2018-05-07 LAB — CBG MONITORING, ED: Glucose-Capillary: 102 mg/dL — ABNORMAL HIGH (ref 70–99)

## 2018-05-07 LAB — I-STAT BETA HCG BLOOD, ED (MC, WL, AP ONLY): I-stat hCG, quantitative: <5 m[IU]/mL (ref <5)

## 2018-05-07 LAB — TSH: TSH: 0.777 u[IU]/mL (ref 0.350–4.500)

## 2018-05-07 MED ORDER — SULFAMETHOXAZOLE-TRIMETHOPRIM 800-160 MG PO TABS: 1.0000 | ORAL_TABLET | Freq: Two times a day (BID) | ORAL | 0 refills | Status: AC

## 2018-05-07 NOTE — Discharge Instructions (Signed)
You were evaluated in the Emergency Department and after careful evaluation, we did not find any emergent condition requiring admission or further testing in the hospital.  Your symptoms today seem to be due to a urinary tract infection.  Please return to the Emergency Department if you experience any worsening of your condition.  We encourage you to follow up with a primary care provider.  Thank you for allowing us to be a part of your care.

## 2018-05-07 NOTE — ED Provider Notes (Signed)
[facility] Emergency Department Provider Note MRN:  [id number]  Arrival date & time: 05/08/18     Chief Complaint   Dizziness   History of Present Illness   [name] Gonzales is a 27 y.o. year-old female with a history of diabetes presenting to the ED with chief complaint of dizziness.  Intermittent dizziness for the past month.  Described as lightheadedness.  Initially was infrequent but now happening 3-4 times a day.  Sometimes while she is being active, sometimes at rest.  Denies headache or vision change, no recent fevers or chills, no recent cough.  No chest pain or shortness of breath.  Endorsing generalized abdominal pain consistent with her IBS, no recent changes to this pain.  Denies dysuria.  Endorsing unchanged neuropathy to her feet, but no new numbness or weakness to arms or legs.  Review of Systems  A complete 10 system review of systems was obtained and all systems are negative except as noted in the HPI and PMH.   Patient's Health History    Past Medical History:  Diagnosis Date  . Bacterial vaginosis   . Bronchitis   . Chlamydia   . Diabetes mellitus   . GERD (gastroesophageal reflux disease)   . Gonorrhea   . Hidradenitis suppurativa   . Neuromuscular disorder (HCC)    neuropathy  . Paresthesia    hands, feet  . Trichomoniasis   . UTI (urinary tract infection)     Past Surgical History:  Procedure Laterality Date  . LAPAROSCOPIC GASTRIC SLEEVE RESECTION N/A 02/03/2016   Procedure: LAPAROSCOPIC GASTRIC SLEEVE RESECTION WITH UPPER ENDO;  Surgeon: De BlanchLuke Aaron Kinsinger, MD;  Location: [facility];  Service: General;  Laterality: N/A;  . OOPHORECTOMY  11/2009   left  . SALPINGOOPHORECTOMY    . TONSILLECTOMY  2002    Family History  Problem Relation Age of Onset  . Stroke Mother        2010  . Heart attack Mother        2010  . Hypertension Mother   . Lupus Sister     Social History   Socioeconomic History  . Marital status: Single   Spouse name: Not on file  . Number of children: Not on file  . Years of education: Not on file  . Highest education level: Not on file  Occupational History  . Not on file  Social Needs  . Financial resource strain: Not on file  . Food insecurity:    Worry: Not on file    Inability: Not on file  . Transportation needs:    Medical: Not on file    Non-medical: Not on file  Tobacco Use  . Smoking status: Current Some Day Smoker    Packs/day: 0.25    Types: Cigarettes    Last attempt to quit: 01/19/2014    Years since quitting: 4.3  . Smokeless tobacco: Never Used  Substance and Sexual Activity  . Alcohol use: Yes    Alcohol/week: 1.0 standard drinks    Types: 1 Standard drinks or equivalent per week  . Drug use: No  . Sexual activity: Yes  Lifestyle  . Physical activity:    Days per week: Not on file    Minutes per session: Not on file  . Stress: Not on file  Relationships  . Social connections:    Talks on phone: Not on file    Gets together: Not on file    Attends religious service: Not on file  Active member of club or organization: Not on file    Attends meetings of clubs or organizations: Not on file    Relationship status: Not on file  . Intimate partner violence:    Fear of current or ex partner: Not on file    Emotionally abused: Not on file    Physically abused: Not on file    Forced sexual activity: Not on file  Other Topics Concern  . Not on file  Social History Narrative  . Not on file     Physical Exam  Vital Signs and Nursing Notes reviewed Vitals:   05/07/18 1700 05/07/18 1752  BP:  124/72  Pulse: 81 80  Resp: 18 16  Temp:    SpO2: 100% 99%    CONSTITUTIONAL: Well-appearing, NAD NEURO:  Alert and oriented x 3, no focal deficits EYES:  eyes equal and reactive ENT/NECK:  no LAD, no JVD CARDIO: Regular rate, well-perfused, normal S1 and S2 PULM:  CTAB no wheezing or rhonchi GI/GU:  normal bowel sounds, non-distended, non-tender MSK/SPINE:   No gross deformities, no edema SKIN:  no rash, atraumatic PSYCH:  Appropriate speech and behavior  Diagnostic and Interventional Summary    EKG Interpretation  Date/Time:  Sunday May 07 2018 16:34:26 EST Ventricular Rate:  81 PR Interval:    QRS Duration: 83 QT Interval:  367 QTC Calculation: 426 R Axis:   52 Text Interpretation:  Sinus rhythm Baseline wander in lead(s) I III aVL Confirmed by Kennis Carina [phone]) on 05/07/2018 4:54:13 PM      Labs Reviewed  CBC - Abnormal; Notable for the following components:      Result Value   MCH 24.5 (*)    MCHC 29.3 (*)    All other components within normal limits  COMPREHENSIVE METABOLIC PANEL - Abnormal; Notable for the following components:   Potassium 3.4 (*)    CO2 21 (*)    Glucose, Bld 107 (*)    Total Protein 6.4 (*)    All other components within normal limits  URINALYSIS, ROUTINE W REFLEX MICROSCOPIC - Abnormal; Notable for the following components:   APPearance CLOUDY (*)    Leukocytes, UA LARGE (*)    WBC, UA >50 (*)    Bacteria, UA RARE (*)    All other components within normal limits  CBG MONITORING, ED - Abnormal; Notable for the following components:   Glucose-Capillary 102 (*)    All other components within normal limits  TSH  I-STAT BETA HCG BLOOD, ED ([facility], [facility], [facility] ONLY)    No orders to display    Medications - No data to display   Procedures Critical Care  ED Course and Medical Decision Making  I have reviewed the triage vital signs and the nursing notes.  Pertinent labs & imaging results that were available during my care of the patient were reviewed by me and considered in my medical decision making (see below for details).  Considering UTI versus pregnancy versus dehydration versus metabolic disarray versus DKA in this 28 year old female with worsening dizziness.  Will screen with EKG, labs, urinalysis, reassess.  EKG on concerning, blood work unremarkable, urinalysis demonstrating infection.   Prescription for Keflex, will follow up with PCP.  After the discussed management above, the patient was determined to be safe for discharge.  The patient was in agreement with this plan and all questions regarding their care were answered.  ED return precautions were discussed and the patient will return to the ED with any  significant worsening of condition.  Elmer Sow. Pilar Plate, MD [facility] Emergency Medicine [facility] [email or web address]  Final Clinical Impressions(s) / ED Diagnoses     ICD-10-CM   1. Acute cystitis without hematuria N30.00     ED Discharge Orders         Ordered    sulfamethoxazole-trimethoprim (BACTRIM DS,SEPTRA DS) 800-160 MG tablet  2 times daily     05/07/18 1744             Sabas Sous, MD 05/08/18 [phone]

## 2018-05-07 NOTE — ED Triage Notes (Signed)
Pt endorses dizzy spells and head pressure occurring 3-4 times a day for the past month. Pt is alert and oriented with no neuro deficits noted. Pt says it will happen when she's sitting or moving. Pt denies her CBG being low when the dizziness occurs.

## 2018-05-07 NOTE — ED Notes (Signed)
ED Provider at bedside. 

## 2018-09-02 ENCOUNTER — Other Ambulatory Visit: Payer: Self-pay

## 2018-09-02 ENCOUNTER — Ambulatory Visit
Admission: EM | Admit: 2018-09-02 | Discharge: 2018-09-02 | Disposition: A | Discharge status: Home / Self Care | Payer: BLUE CROSS/BLUE SHIELD | Attending: Physician Assistant | Admitting: Physician Assistant

## 2018-09-02 DIAGNOSIS — N939 Abnormal uterine and vaginal bleeding, unspecified: Secondary | ICD-10-CM | POA: Diagnosis present

## 2018-09-02 LAB — POCT URINALYSIS DIP (MANUAL ENTRY)
Bilirubin, UA: NEGATIVE
Glucose, UA: NEGATIVE mg/dL
Ketones, POC UA: NEGATIVE mg/dL
Leukocytes, UA: NEGATIVE
NITRITE UA: NEGATIVE
PH UA: 6 (ref 5.0–8.0)
Protein Ur, POC: NEGATIVE mg/dL
UROBILINOGEN UA: 0.2 U/dL

## 2018-09-02 LAB — POCT URINE PREGNANCY: PREG TEST UR: NEGATIVE

## 2018-09-02 MED ORDER — MEGESTROL ACETATE 40 MG PO TABS: 40.0000 mg | ORAL_TABLET | Freq: Two times a day (BID) | ORAL | 0 refills | Status: AC

## 2018-09-02 NOTE — Discharge Instructions (Signed)
Stop current birth control. Start megace as directed until bleeding stops or seeing GYN. If experiencing worsening symptoms, weakness, passing out, worsening abdominal pain, nausea/vomiting, go to the emergency department for further evaluation needed.

## 2018-09-02 NOTE — ED Triage Notes (Signed)
Per pt she has been on same birth control for over 2 years and have had no problems until February. Pt says that periods are suppose to be every three months. Pt says it changes from week to week. One week will be heavy the next will lighten up and then come right back. Some abdominal pain and some nausea.

## 2018-09-02 NOTE — ED Provider Notes (Signed)
[facility]    CSN: [id number] Arrival date & time: 09/02/18  1437     History   Chief Complaint Chief Complaint  Patient presents with  . Vaginal Bleeding    HPI [name] Gonzales is a 28 y.o. female.   28 year old female comes in for abnormal uterine bleeding.  States she is on Joles birth control, and gets a cycle every 3 months.  Starting July 25, 2018, she has had daily bleeding ranging from spotting to needing pads.  She has continued her birth control without any relief.  A normal cycle for her last about 4 to 5 days, using 5 tampons per day, long pads at nighttime.  On a heavy day currently, uses the same amount as when she is on the cycle, on a light day, will fill up a panty liner.  She has intermittent suprapubic pain without nausea or vomiting.  Denies fever, chills, night sweats.  She denies any aggravating or alleviating factor.  Still able to eat and drink without difficulty.  She denies vaginal discharge, itching, pain.  Has urinary frequency without dysuria or hematuria.  She is sexually active with one female partner, no condom use.  She is currently in [place] for college, GYN is back at home, is trying to make an appointment in 2 weeks when she returns home.  Patient with history of PCOS, was once on metformin with birth control to control.  Since gastric bypass, she has lost significant weight, and states GYN had told her that PCOS has resolved.     Past Medical History:  Diagnosis Date  . Bacterial vaginosis   . Bronchitis   . Chlamydia   . Diabetes mellitus   . GERD (gastroesophageal reflux disease)   . Gonorrhea   . Hidradenitis suppurativa   . Neuromuscular disorder (HCC)    neuropathy  . Paresthesia    hands, feet  . Trichomoniasis   . UTI (urinary tract infection)     Patient Active Problem List   Diagnosis Date Noted  . Morbid obesity (HCC) 02/03/2016  . Hidradenitis suppurativa 07/09/2015  . Screening examination for venereal  disease 07/09/2015  . Pilonidal disease 10/18/2013    Past Surgical History:  Procedure Laterality Date  . LAPAROSCOPIC GASTRIC SLEEVE RESECTION N/A 02/03/2016   Procedure: LAPAROSCOPIC GASTRIC SLEEVE RESECTION WITH UPPER ENDO;  Surgeon: De Blanch Kinsinger, MD;  Location: [facility];  Service: General;  Laterality: N/A;  . OOPHORECTOMY  11/2009   left  . SALPINGOOPHORECTOMY    . TONSILLECTOMY  2002    OB History   No obstetric history on file.      Home Medications    Prior to Admission medications   Medication Sig Start Date End Date Taking? Authorizing Provider  albuterol (PROVENTIL HFA;VENTOLIN HFA) 108 (90 Base) MCG/ACT inhaler Inhale 2 puffs into the lungs every 4 (four) hours as needed for wheezing or shortness of breath (or coughing). 12/01/17  Yes Dione Booze, MD  esomeprazole (NEXIUM) 20 MG capsule Take 20 mg by mouth at bedtime as needed (Heart burn).    Yes [provider]  gabapentin (NEURONTIN) 100 MG capsule Take 100 mg by mouth at bedtime.    Yes [provider]  levonorgestrel-ethinyl estradiol (SEASONALE,INTROVALE,JOLESSA) 0.15-0.03 MG tablet Take 1 tablet by mouth daily.    Yes [provider]  pantoprazole (PROTONIX) 40 MG tablet Take 40 mg by mouth daily.   Yes [provider]  spironolactone (ALDACTONE) 25 MG tablet Take 25 mg by  mouth daily.   Yes [provider]  Vitamin D, Ergocalciferol, (DRISDOL) 50000 units CAPS capsule Take 50,000 Units by mouth every 7 (seven) days.   Yes [provider]  Dulaglutide (TRULICITY) 1.5 MG/0.5ML SOPN INJECT 1.5 MG SUBCUTANEOUSLY ONCE A WEEK 08/25/17   [provider]  megestrol (MEGACE) 40 MG tablet Take 1 tablet (40 mg total) by mouth 2 (two) times daily for 14 days. 09/02/18 09/16/18  Belinda Fisher, PA-C    Family History Family History  Problem Relation Age of Onset  . Stroke Mother        2010  . Heart attack Mother        2010  . Hypertension Mother   . Lupus  Sister     Social History Social History   Tobacco Use  . Smoking status: Current Some Day Smoker    Packs/day: 0.25    Types: Cigarettes    Last attempt to quit: 01/19/2014    Years since quitting: 4.6  . Smokeless tobacco: Never Used  Substance Use Topics  . Alcohol use: Yes    Alcohol/week: 1.0 standard drinks    Types: 1 Standard drinks or equivalent per week  . Drug use: No     Allergies   Penicillins   Review of Systems Review of Systems  Reason unable to perform ROS: See HPI as above.     Physical Exam Triage Vital Signs ED Triage Vitals  Enc Vitals Group     BP 09/02/18 1447 125/78     Pulse Rate 09/02/18 1447 95     Resp 09/02/18 1447 16     Temp 09/02/18 1447 97.8 F (36.6 C)     Temp Source 09/02/18 1447 Oral     SpO2 09/02/18 1447 97 %     Weight 09/02/18 1458 225 lb (102.1 kg)     Height 09/02/18 1458  (2.032 m)     Head Circumference --      Peak Flow --      Pain Score 09/02/18 1457 5     Pain Loc --      Pain Edu? --      Excl. in GC? --    No data found.  Updated Vital Signs BP 125/78 (BP Location: Left Arm)   Pulse 95   Temp 97.8 F (36.6 C) (Oral)   Resp 16   Ht  (2.032 m)   Wt 225 lb (102.1 kg)   SpO2 97%   BMI 24.72 kg/m   Physical Exam Constitutional:      General: She is not in acute distress.    Appearance: She is well-developed. She is not ill-appearing, toxic-appearing or diaphoretic.  HENT:     Head: Normocephalic and atraumatic.  Eyes:     Conjunctiva/sclera: Conjunctivae normal.     Pupils: Pupils are equal, round, and reactive to light.  Cardiovascular:     Rate and Rhythm: Normal rate and regular rhythm.     Heart sounds: Normal heart sounds. No murmur. No friction rub. No gallop.   Pulmonary:     Effort: Pulmonary effort is normal.     Breath sounds: Normal breath sounds. No wheezing or rales.  Abdominal:     General: Bowel sounds are normal.     Palpations: Abdomen is soft.     Tenderness:  There is no abdominal tenderness. There is no right CVA tenderness, left CVA tenderness, guarding or rebound.  Skin:    General: Skin is  warm and dry.     Coloration: Skin is not pale.  Neurological:     Mental Status: She is alert and oriented to person, place, and time.  Psychiatric:        Behavior: Behavior normal.        Judgment: Judgment normal.      UC Treatments / Results  Labs (all labs ordered are listed, but only abnormal results are displayed) Labs Reviewed  POCT URINALYSIS DIP (MANUAL ENTRY) - Abnormal; Notable for the following components:      Result Value   Clarity, UA hazy (*)    Spec Grav, UA >=1.030 (*)    Blood, UA large (*)    All other components within normal limits  POCT URINE PREGNANCY - Normal  CERVICOVAGINAL ANCILLARY ONLY    EKG None  Radiology No results found.  Procedures Procedures (including critical care time)  Medications Ordered in UC Medications - No data to display  Initial Impression / Assessment and Plan / UC Course  I have reviewed the triage vital signs and the nursing notes.  Pertinent labs & imaging results that were available during my care of the patient were reviewed by me and considered in my medical decision making (see chart for details).    Urine dipstick without infection or pregnancy.  Cytology obtained.  Discussed with patient, unable to check for anemia from bleeding, however, she is without tachycardia, lower suspicion for acute blood loss requiring transfusion.  Will have patient stop birth control, start Megace for abnormal uterine bleeding.  Will have patient continue to monitor symptoms, follow-up with GYN for further evaluation of abnormal uterine bleeding.  Return precautions given.  Patient expresses understanding and agrees to plan.  Final Clinical Impressions(s) / UC Diagnoses   Final diagnoses:  Abnormal uterine bleeding (AUB)    ED Prescriptions    Medication Sig Dispense Auth. Provider    megestrol (MEGACE) 40 MG tablet Take 1 tablet (40 mg total) by mouth 2 (two) times daily for 14 days. 28 tablet Threasa Alpha, [place] 09/02/18 1638

## 2018-09-05 LAB — CERVICOVAGINAL ANCILLARY ONLY
BACTERIAL VAGINITIS: NEGATIVE
Candida vaginitis: POSITIVE — AB
Chlamydia: NEGATIVE
Neisseria Gonorrhea: NEGATIVE
Trichomonas: NEGATIVE

## 2018-09-07 ENCOUNTER — Telehealth (HOSPITAL_COMMUNITY): Payer: Self-pay | Admitting: Emergency Medicine

## 2018-09-07 MED ORDER — FLUCONAZOLE 150 MG PO TABS: 150.0000 mg | ORAL_TABLET | Freq: Once | ORAL | 0 refills | Status: AC

## 2018-09-07 NOTE — Telephone Encounter (Signed)
Patient contacted and made aware of all results, all questions answered.   

## 2018-09-07 NOTE — Telephone Encounter (Signed)
Test for candida (yeast) was positive. Prescription for fluconazole 150mg po now, repeat dose in 3d if needed, #2 no refills, sent to the pharmacy of record. Recheck or followup with PCP for further evaluation if symptoms are not improving.    Attempted to reach patient. No answer at this time. Mailbox full. 

## 2019-03-04 ENCOUNTER — Ambulatory Visit
Admission: EM | Admit: 2019-03-04 | Discharge: 2019-03-04 | Disposition: A | Discharge status: Home / Self Care | Payer: BC Managed Care – PPO | Attending: Emergency Medicine | Admitting: Emergency Medicine

## 2019-03-04 ENCOUNTER — Encounter: Payer: Self-pay | Admitting: Emergency Medicine

## 2019-03-04 ENCOUNTER — Other Ambulatory Visit: Payer: Self-pay

## 2019-03-04 DIAGNOSIS — J309 Allergic rhinitis, unspecified: Secondary | ICD-10-CM

## 2019-03-04 DIAGNOSIS — J302 Other seasonal allergic rhinitis: Secondary | ICD-10-CM | POA: Diagnosis not present

## 2019-03-04 MED ORDER — NAPHAZOLINE-PHENIRAMINE 0.025-0.3 % OP SOLN: 1.0000 [drp] | OPHTHALMIC | 0 refills | Status: AC | PRN

## 2019-03-04 NOTE — ED Provider Notes (Signed)
[facility]    CSN: [id number] Arrival date & time: 03/04/19  1006      History   Chief Complaint Chief Complaint  Patient presents with  . Nasal Congestion  . Cough    HPI [name] Gonzales is a 28 y.o. female history of diabetes, obesity presenting for dry, nonproductive, non-hemoptic cough, nasal congestion, postnasal drip, sore throat since Thursday.  Patient endorses history of seasonal allergies, "usually happens around this time a year until Christmas, sometimes March ", though has not take anything for this.  Patient also endorsing itchy, watery eyes.  No known sick contacts, exposure to COVID positive persons.   Past Medical History:  Diagnosis Date  . Bacterial vaginosis   . Bronchitis   . Chlamydia   . Diabetes mellitus   . GERD (gastroesophageal reflux disease)   . Gonorrhea   . Hidradenitis suppurativa   . Neuromuscular disorder (HCC)    neuropathy  . Paresthesia    hands, feet  . Trichomoniasis   . UTI (urinary tract infection)     Patient Active Problem List   Diagnosis Date Noted  . Morbid obesity (HCC) 02/03/2016  . Hidradenitis suppurativa 07/09/2015  . Screening examination for venereal disease 07/09/2015  . Pilonidal disease 10/18/2013    Past Surgical History:  Procedure Laterality Date  . LAPAROSCOPIC GASTRIC SLEEVE RESECTION N/A 02/03/2016   Procedure: LAPAROSCOPIC GASTRIC SLEEVE RESECTION WITH UPPER ENDO;  Surgeon: De Blanch Kinsinger, MD;  Location: [facility];  Service: General;  Laterality: N/A;  . OOPHORECTOMY  11/2009   left  . SALPINGOOPHORECTOMY    . TONSILLECTOMY  2002    OB History   No obstetric history on file.      Home Medications    Prior to Admission medications   Medication Sig Start Date End Date Taking? Authorizing Provider  albuterol (PROVENTIL HFA;VENTOLIN HFA) 108 (90 Base) MCG/ACT inhaler Inhale 2 puffs into the lungs every 4 (four) hours as needed for wheezing or shortness of breath (or coughing).  12/01/17   Dione Booze, MD  Dulaglutide (TRULICITY) 1.5 MG/0.5ML SOPN INJECT 1.5 MG SUBCUTANEOUSLY ONCE A WEEK 08/25/17   [provider]  esomeprazole (NEXIUM) 20 MG capsule Take 20 mg by mouth at bedtime as needed (Heart burn).     [provider]  gabapentin (NEURONTIN) 100 MG capsule Take 100 mg by mouth at bedtime.     [provider]  levonorgestrel-ethinyl estradiol (SEASONALE,INTROVALE,JOLESSA) 0.15-0.03 MG tablet Take 1 tablet by mouth daily.     [provider]  naphazoline-pheniramine (NAPHCON-A) 0.025-0.3 % ophthalmic solution Place 1 drop into both eyes every 4 (four) hours as needed for eye irritation. 03/04/19   Hall-Potvin, [place], PA-C  pantoprazole (PROTONIX) 40 MG tablet Take 40 mg by mouth daily.    [provider]  spironolactone (ALDACTONE) 25 MG tablet Take 25 mg by mouth daily.    [provider]  Vitamin D, Ergocalciferol, (DRISDOL) 50000 units CAPS capsule Take 50,000 Units by mouth every 7 (seven) days.    [provider]    Family History Family History  Problem Relation Age of Onset  . Stroke Mother        2010  . Heart attack Mother        2010  . Hypertension Mother   . Lupus Sister     Social History Social History   Tobacco Use  . Smoking status: Current Some Day Smoker    Packs/day: 0.25    Types: Cigarettes  Last attempt to quit: 01/19/2014    Years since quitting: 5.1  . Smokeless tobacco: Never Used  Substance Use Topics  . Alcohol use: Yes    Alcohol/week: 1.0 standard drinks    Types: 1 Standard drinks or equivalent per week  . Drug use: No     Allergies   Penicillins   Review of Systems Review of Systems  Constitutional: Negative for activity change, appetite change, fatigue and fever.  HENT: Positive for postnasal drip, rhinorrhea and sore throat. Negative for congestion, dental problem, ear pain, facial swelling, hearing loss, sinus pain, tinnitus, trouble swallowing  and voice change.   Eyes: Positive for discharge and itching. Negative for photophobia, pain, redness and visual disturbance.  Respiratory: Positive for cough. Negative for chest tightness, shortness of breath, wheezing and stridor.   Cardiovascular: Negative for chest pain and palpitations.  Gastrointestinal: Negative for abdominal distention, abdominal pain, diarrhea and vomiting.  Musculoskeletal: Negative for arthralgias and myalgias.  Skin: Negative for rash and wound.  Neurological: Negative for dizziness, syncope and headaches.      Physical Exam Triage Vital Signs ED Triage Vitals  Enc Vitals Group     BP      Pulse      Resp      Temp      Temp src      SpO2      Weight      Height      Head Circumference      Peak Flow      Pain Score      Pain Loc      Pain Edu?      Excl. in GC?    No data found.  Updated Vital Signs BP 109/69 (BP Location: Right Arm)   Pulse 82   Temp 98 F (36.7 C) (Oral)   Resp 16   SpO2 97%    Physical Exam Constitutional:      General: She is not in acute distress.    Appearance: She is obese. She is not ill-appearing.  HENT:     Head: Normocephalic and atraumatic.     Jaw: There is normal jaw occlusion. No tenderness or pain on movement.     Right Ear: Hearing, tympanic membrane, ear canal and external ear normal. No tenderness. No mastoid tenderness.     Left Ear: Hearing, tympanic membrane, ear canal and external ear normal. No tenderness. No mastoid tenderness.     Nose: No nasal deformity, septal deviation or nasal tenderness.     Right Turbinates: Not swollen or pale.     Left Turbinates: Not swollen or pale.     Right Sinus: No maxillary sinus tenderness or frontal sinus tenderness.     Left Sinus: No maxillary sinus tenderness or frontal sinus tenderness.     Comments: Bilateral turbinate edema with mucosal pallor    Mouth/Throat:     Lips: Pink. No lesions.     Mouth: Mucous membranes are moist. No injury.      Pharynx: Oropharynx is clear. Uvula midline. No posterior oropharyngeal erythema or uvula swelling.     Comments: no tonsillar exudate or hypertrophy Eyes:     General: No scleral icterus.    Conjunctiva/sclera: Conjunctivae normal.     Pupils: Pupils are equal, round, and reactive to light.     Comments: watery discharge bilaterally  Neck:     Musculoskeletal: Normal range of motion and neck supple. No muscular tenderness.  Cardiovascular:  Rate and Rhythm: Normal rate and regular rhythm.     Heart sounds: No murmur. No gallop.   Pulmonary:     Effort: Pulmonary effort is normal. No respiratory distress.     Breath sounds: No wheezing, rhonchi or rales.  Lymphadenopathy:     Cervical: No cervical adenopathy.  Skin:    General: Skin is warm.     Capillary Refill: Capillary refill takes less than 2 seconds.     Coloration: Skin is not jaundiced or pale.  Neurological:     General: No focal deficit present.     Mental Status: She is alert and oriented to person, place, and time.      UC Treatments / Results  Labs (all labs ordered are listed, but only abnormal results are displayed) Labs Reviewed - No data to display  EKG   Radiology No results found.  Procedures Procedures (including critical care time)  Medications Ordered in UC Medications - No data to display  Initial Impression / Assessment and Plan / UC Course  I have reviewed the triage vital signs and the nursing notes.  Pertinent labs & imaging results that were available during my care of the patient were reviewed by me and considered in my medical decision making (see chart for details).     1.  Allergic rhinitis Second to seasonal allergies.  Stressed importance of compliance with daily antihistamine, intranasal steroid use.  Will increase water intake, monitor symptoms over the next few days and add antihistamine eyedrops for eye symptom relief.  Return precautions discussed, patient verbalized  understanding and is agreeable to plan. Final Clinical Impressions(s) / UC Diagnoses   Final diagnoses:  Seasonal allergies  Allergic rhinitis, unspecified seasonality, unspecified trigger     Discharge Instructions     Recommend using the following to help with your allergic rhinitis symptoms: 2 sprays of Flonase/Nasacort once daily Daytime antihistamine (can be Claritin, Allegra, Xyzal, etc) Drink plenty of water throughout the day: Goal should be 1 gallon, and avoid caffeinated/sugary beverages You can also use warm steam with/without Vicks VapoRub for additional comfort/congestion relief Return for worsening sinus pain, ear pain, fever, sore throat.    ED Prescriptions    Medication Sig Dispense Auth. Provider   naphazoline-pheniramine (NAPHCON-A) 0.025-0.3 % ophthalmic solution Place 1 drop into both eyes every 4 (four) hours as needed for eye irritation. 5 mL Hall-Potvin, [place], PA-C     Controlled Substance Prescriptions National Controlled Substance Registry consulted? Not Applicable   Quincy Sheehan, [place] 03/04/19 1433

## 2019-03-04 NOTE — Discharge Instructions (Addendum)
Recommend using the following to help with your allergic rhinitis symptoms: 2 sprays of Flonase/Nasacort once daily Daytime antihistamine (can be Claritin, Allegra, Xyzal, etc) Drink plenty of water throughout the day: Goal should be 1 gallon, and avoid caffeinated/sugary beverages You can also use warm steam with/without Vicks VapoRub for additional comfort/congestion relief Return for worsening sinus pain, ear pain, fever, sore throat.

## 2019-03-04 NOTE — ED Triage Notes (Signed)
Per pt she has been having cough, nasal congestion since Thursday. Pt said she is taking sudafed and mucinex with some relief but says her nose keeps running and itchy eyes. No allergy meds. 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

## 2019-03-05 ENCOUNTER — Telehealth: Payer: Self-pay | Admitting: Emergency Medicine

## 2019-03-05 NOTE — Telephone Encounter (Signed)
Checked in on patient, discussed medications, and encouraged return call with any continuing questions or concerns.    

## 2019-12-03 ENCOUNTER — Ambulatory Visit: Payer: Self-pay

## 2019-12-03 ENCOUNTER — Ambulatory Visit (HOSPITAL_COMMUNITY): Payer: Self-pay

## 2020-06-24 DIAGNOSIS — R69 Illness, unspecified: Secondary | ICD-10-CM | POA: Diagnosis not present

## 2020-07-01 DIAGNOSIS — R69 Illness, unspecified: Secondary | ICD-10-CM | POA: Diagnosis not present

## 2020-07-08 DIAGNOSIS — R69 Illness, unspecified: Secondary | ICD-10-CM | POA: Diagnosis not present

## 2020-07-15 DIAGNOSIS — R69 Illness, unspecified: Secondary | ICD-10-CM | POA: Diagnosis not present

## 2020-07-17 DIAGNOSIS — E282 Polycystic ovarian syndrome: Secondary | ICD-10-CM | POA: Diagnosis not present

## 2020-07-17 DIAGNOSIS — N926 Irregular menstruation, unspecified: Secondary | ICD-10-CM | POA: Diagnosis not present

## 2020-07-17 DIAGNOSIS — R102 Pelvic and perineal pain: Secondary | ICD-10-CM | POA: Diagnosis not present

## 2020-07-17 DIAGNOSIS — Z3202 Encounter for pregnancy test, result negative: Secondary | ICD-10-CM | POA: Diagnosis not present

## 2020-07-22 DIAGNOSIS — R69 Illness, unspecified: Secondary | ICD-10-CM | POA: Diagnosis not present

## 2020-07-27 ENCOUNTER — Other Ambulatory Visit: Payer: Self-pay

## 2020-07-27 ENCOUNTER — Ambulatory Visit (HOSPITAL_COMMUNITY)
Admission: EM | Admit: 2020-07-27 | Discharge: 2020-07-27 | Disposition: A | Discharge status: Home / Self Care | Payer: Managed Care, Other (non HMO) | Attending: Emergency Medicine | Admitting: Emergency Medicine

## 2020-07-27 ENCOUNTER — Encounter (HOSPITAL_COMMUNITY): Payer: Self-pay | Admitting: *Deleted

## 2020-07-27 DIAGNOSIS — F17210 Nicotine dependence, cigarettes, uncomplicated: Secondary | ICD-10-CM | POA: Diagnosis not present

## 2020-07-27 DIAGNOSIS — Z79899 Other long term (current) drug therapy: Secondary | ICD-10-CM | POA: Insufficient documentation

## 2020-07-27 DIAGNOSIS — Z113 Encounter for screening for infections with a predominantly sexual mode of transmission: Secondary | ICD-10-CM | POA: Diagnosis not present

## 2020-07-27 DIAGNOSIS — Z793 Long term (current) use of hormonal contraceptives: Secondary | ICD-10-CM | POA: Diagnosis not present

## 2020-07-27 DIAGNOSIS — N939 Abnormal uterine and vaginal bleeding, unspecified: Secondary | ICD-10-CM | POA: Insufficient documentation

## 2020-07-27 DIAGNOSIS — K5900 Constipation, unspecified: Secondary | ICD-10-CM | POA: Diagnosis not present

## 2020-07-27 DIAGNOSIS — L732 Hidradenitis suppurativa: Secondary | ICD-10-CM

## 2020-07-27 DIAGNOSIS — R1030 Lower abdominal pain, unspecified: Secondary | ICD-10-CM | POA: Insufficient documentation

## 2020-07-27 DIAGNOSIS — K219 Gastro-esophageal reflux disease without esophagitis: Secondary | ICD-10-CM | POA: Diagnosis not present

## 2020-07-27 DIAGNOSIS — Z880 Allergy status to penicillin: Secondary | ICD-10-CM | POA: Insufficient documentation

## 2020-07-27 DIAGNOSIS — Z3202 Encounter for pregnancy test, result negative: Secondary | ICD-10-CM | POA: Diagnosis not present

## 2020-07-27 LAB — POC URINE PREG, ED: Preg Test, Ur: NEGATIVE

## 2020-07-27 MED ORDER — DOCUSATE SODIUM 100 MG PO CAPS: 100.0000 mg | ORAL_CAPSULE | Freq: Two times a day (BID) | ORAL | 0 refills | Status: AC

## 2020-07-27 MED ORDER — OMEPRAZOLE 20 MG PO CPDR: 20.0000 mg | DELAYED_RELEASE_CAPSULE | Freq: Every day | ORAL | 0 refills | Status: AC

## 2020-07-27 MED ORDER — SUCRALFATE 1 GM/10ML PO SUSP: 1.0000 g | Freq: Three times a day (TID) | ORAL | 0 refills | Status: AC

## 2020-07-27 NOTE — Discharge Instructions (Addendum)
Negative again for pregnancy, we will call if you have any positive findings from your vaginal testing.  Drink plenty of water and get regular exercise to prevent constipation.  Colace daily may help promote regular bowel movements.  Continue with your prescribed regimen of birth control medications.  Apply heat to the area of your breast as well as complete course of antibiotics.  Follow up with GI as scheduled, as well as with gynecology as scheduled.  Return for any worsening of symptom

## 2020-07-27 NOTE — ED Triage Notes (Signed)
Pt reports abscess under RT breast that is not improving. Pt taking anti-bx. Pt also has a irregular menses and has been spotting for several weeks.

## 2020-07-27 NOTE — ED Provider Notes (Signed)
[facility]    CSN: [id number] Arrival date & time: 07/27/20  1720      History   Chief Complaint Chief Complaint  Patient presents with  . Abscess    Under Rt breast  . Menstrual Problem    HPI [name] Gonzales is a 30 y.o. female.   [name] Gonzales presents with multiple complaints. She has a history of HS, with a recurrent abscess under right breast. She had been given a have and hold course of bactrim, and started taking it. She feels the abscess has improved some. Hasn't drained. Still some tenderness.   Also with complaints of constipation. She did poop today but had to take colace the past few days. She feels some low abdominal pain. This is not a new issue necessarily for her. She saw her gynecologist 1/27 and was referred back to her GI doctor for this. She also has a pelvic ultrasound scheduled for 2/15. She has had vaginal spotting and breakthrough bleeding. She has been taking oral birth control, she did miss one pill but restarted it. She is now due to have a period but has not. No other vaginal complaints.  She experiences heartburn/ epigastric pain. Has used omeprazole in the past which has helped. She had a gastric sleeve surgery in 2017.    ROS per HPI, negative if not otherwise mentioned.      Past Medical History:  Diagnosis Date  . Bacterial vaginosis   . Bronchitis   . Chlamydia   . Diabetes mellitus   . GERD (gastroesophageal reflux disease)   . Gonorrhea   . Hidradenitis suppurativa   . Neuromuscular disorder (HCC)    neuropathy  . Paresthesia    hands, feet  . Trichomoniasis   . UTI (urinary tract infection)     Patient Active Problem List   Diagnosis Date Noted  . Morbid obesity (HCC) 02/03/2016  . Hidradenitis suppurativa 07/09/2015  . Screening examination for venereal disease 07/09/2015  . Pilonidal disease 10/18/2013    Past Surgical History:  Procedure Laterality Date  . LAPAROSCOPIC GASTRIC SLEEVE RESECTION N/A  02/03/2016   Procedure: LAPAROSCOPIC GASTRIC SLEEVE RESECTION WITH UPPER ENDO;  Surgeon: De Blanch Kinsinger, MD;  Location: [facility];  Service: General;  Laterality: N/A;  . OOPHORECTOMY  11/2009   left  . SALPINGOOPHORECTOMY    . TONSILLECTOMY  2002    OB History   No obstetric history on file.      Home Medications    Prior to Admission medications   Medication Sig Start Date End Date Taking? Authorizing Provider  docusate sodium (COLACE) 100 MG capsule Take 1 capsule (100 mg total) by mouth every 12 (twelve) hours. 07/27/20  Yes Linus Mako B, NP  omeprazole (PRILOSEC) 20 MG capsule Take 1 capsule (20 mg total) by mouth daily. 07/27/20  Yes Burky, Dorene Grebe B, NP  sucralfate (CARAFATE) 1 GM/10ML suspension Take 10 mLs (1 g total) by mouth 4 (four) times daily -  with meals and at bedtime. 07/27/20  Yes Burky, Barron Alvine, NP  albuterol (PROVENTIL HFA;VENTOLIN HFA) 108 (90 Base) MCG/ACT inhaler Inhale 2 puffs into the lungs every 4 (four) hours as needed for wheezing or shortness of breath (or coughing). 12/01/17   Dione Booze, MD  Dulaglutide (TRULICITY) 1.5 MG/0.5ML SOPN INJECT 1.5 MG SUBCUTANEOUSLY ONCE A WEEK 08/25/17   [provider]  esomeprazole (NEXIUM) 20 MG capsule Take 20 mg by mouth at bedtime as needed (Heart burn).     [provider]  gabapentin (NEURONTIN) 100 MG capsule Take 100 mg by mouth at bedtime.     [provider]  levonorgestrel-ethinyl estradiol (SEASONALE,INTROVALE,JOLESSA) 0.15-0.03 MG tablet Take 1 tablet by mouth daily.     [provider]  naphazoline-pheniramine (NAPHCON-A) 0.025-0.3 % ophthalmic solution Place 1 drop into both eyes every 4 (four) hours as needed for eye irritation. 03/04/19   Hall-Potvin, [place], PA-C  pantoprazole (PROTONIX) 40 MG tablet Take 40 mg by mouth daily.    [provider]  spironolactone (ALDACTONE) 25 MG tablet Take 25 mg by mouth daily.    [provider]  Vitamin D,  Ergocalciferol, (DRISDOL) 50000 units CAPS capsule Take 50,000 Units by mouth every 7 (seven) days.    [provider]    Family History Family History  Problem Relation Age of Onset  . Stroke Mother        2010  . Heart attack Mother        2010  . Hypertension Mother   . Lupus Sister     Social History Social History   Tobacco Use  . Smoking status: Current Some Day Smoker    Packs/day: 0.25    Types: Cigarettes    Last attempt to quit: 01/19/2014    Years since quitting: 6.5  . Smokeless tobacco: Never Used  Substance Use Topics  . Alcohol use: Yes    Alcohol/week: 1.0 standard drink    Types: 1 Standard drinks or equivalent per week  . Drug use: No     Allergies   Penicillins   Review of Systems Review of Systems   Physical Exam Triage Vital Signs ED Triage Vitals  Enc Vitals Group     BP 07/27/20 1735 123/75     Pulse Rate 07/27/20 1735 78     Resp 07/27/20 1735 18     Temp 07/27/20 1735 99.3 F (37.4 C)     Temp Source 07/27/20 1735 Oral     SpO2 07/27/20 1735 100 %     Weight --      Height --      Head Circumference --      Peak Flow --      Pain Score 07/27/20 1738 7     Pain Loc --      Pain Edu? --      Excl. in GC? --    No data found.  Updated Vital Signs BP 123/75 (BP Location: Right Arm)   Pulse 78   Temp 99.3 F (37.4 C) (Oral)   Resp 18   LMP 06/30/2020   SpO2 100%   Visual Acuity Right Eye Distance:   Left Eye Distance:   Bilateral Distance:    Right Eye Near:   Left Eye Near:    Bilateral Near:     Physical Exam Constitutional:      General: She is not in acute distress.    Appearance: She is well-developed.  Cardiovascular:     Rate and Rhythm: Normal rate.  Pulmonary:     Effort: Pulmonary effort is normal.  Chest:       Comments: Scarring and some firmness of tissue under right breast without fluctuance; no redness; minimal tenderness; no pustule Abdominal:     Tenderness: There is no right CVA  tenderness or left CVA tenderness.  Skin:    General: Skin is warm and dry.  Neurological:     Mental Status: She is alert and oriented to person, place, and time.  UC Treatments / Results  Labs (all labs ordered are listed, but only abnormal results are displayed) Labs Reviewed  POC URINE PREG, ED  CERVICOVAGINAL ANCILLARY ONLY    EKG   Radiology No results found.  Procedures Procedures (including critical care time)  Medications Ordered in UC Medications - No data to display  Initial Impression / Assessment and Plan / UC Course  I have reviewed the triage vital signs and the nursing notes.  Pertinent labs & imaging results that were available during my care of the patient were reviewed by me and considered in my medical decision making (see chart for details).     Following with gyne related to vaginal spotting- they question if poor absorption of oral pills causing breakthrough bleeding. Has ultrasound scheduled. Negative urine pregnancy test here today with vaginal cytology pending.  Has appointment with GI for follow up related to constipation in context of gastric sleeve. Dulcolax regularly may be helpful. Lifestyle changes recommended.  Continue course of bactrim for recurrent right breast abscess, no indciation for I&D tonight, patient agrees with this, continue with heat application as well.  Omeprazole and Carafate provided per patient request.   Final Clinical Impressions(s) / UC Diagnoses   Final diagnoses:  Vaginal spotting  Constipation, unspecified constipation type  Hidradenitis  Gastric reflux     Discharge Instructions     Negative again for pregnancy, we will call if you have any positive findings from your vaginal testing.  Drink plenty of water and get regular exercise to prevent constipation.  Colace daily may help promote regular bowel movements.  Continue with your prescribed regimen of birth control medications.  Apply heat to the  area of your breast as well as complete course of antibiotics.  Follow up with GI as scheduled, as well as with gynecology as scheduled.  Return for any worsening of symptom    ED Prescriptions    Medication Sig Dispense Auth. Provider   omeprazole (PRILOSEC) 20 MG capsule Take 1 capsule (20 mg total) by mouth daily. 30 capsule Linus Mako B, NP   sucralfate (CARAFATE) 1 GM/10ML suspension Take 10 mLs (1 g total) by mouth 4 (four) times daily -  with meals and at bedtime. 420 mL Linus Mako B, NP   docusate sodium (COLACE) 100 MG capsule Take 1 capsule (100 mg total) by mouth every 12 (twelve) hours. 60 capsule Georgetta Haber, NP     PDMP not reviewed this encounter.   Georgetta Haber, NP 07/27/20 1815

## 2020-07-28 ENCOUNTER — Telehealth (HOSPITAL_COMMUNITY): Payer: Self-pay | Admitting: Emergency Medicine

## 2020-07-28 ENCOUNTER — Telehealth (HOSPITAL_COMMUNITY): Payer: Self-pay

## 2020-07-28 LAB — CERVICOVAGINAL ANCILLARY ONLY
Bacterial Vaginitis (gardnerella): NEGATIVE
Candida Glabrata: NEGATIVE
Candida Vaginitis: NEGATIVE
Chlamydia: NEGATIVE
Comment: NEGATIVE
Comment: NEGATIVE
Comment: NEGATIVE
Comment: NEGATIVE
Comment: NEGATIVE
Comment: NORMAL
Neisseria Gonorrhea: NEGATIVE
Trichomonas: POSITIVE — AB

## 2020-07-28 MED ORDER — METRONIDAZOLE 500 MG PO TABS
500.0000 mg | ORAL_TABLET | Freq: Two times a day (BID) | ORAL | 0 refills | Status: DC
Start: 2020-07-28 — End: 2020-07-28

## 2020-07-28 MED ORDER — METRONIDAZOLE 500 MG PO TABS: 500.0000 mg | ORAL_TABLET | Freq: Two times a day (BID) | ORAL | 0 refills | Status: AC

## 2020-07-28 NOTE — Telephone Encounter (Signed)
Patient wanted prescritpion sent to a different pharmacy

## 2020-08-03 DIAGNOSIS — R69 Illness, unspecified: Secondary | ICD-10-CM | POA: Diagnosis not present

## 2020-08-06 DIAGNOSIS — R69 Illness, unspecified: Secondary | ICD-10-CM | POA: Diagnosis not present

## 2020-08-13 DIAGNOSIS — R69 Illness, unspecified: Secondary | ICD-10-CM | POA: Diagnosis not present

## 2020-08-14 ENCOUNTER — Encounter: Payer: Self-pay | Admitting: Obstetrics & Gynecology

## 2020-08-14 DIAGNOSIS — Z3202 Encounter for pregnancy test, result negative: Secondary | ICD-10-CM | POA: Diagnosis not present

## 2020-08-14 DIAGNOSIS — E282 Polycystic ovarian syndrome: Secondary | ICD-10-CM | POA: Diagnosis not present

## 2020-08-14 DIAGNOSIS — Z3009 Encounter for other general counseling and advice on contraception: Secondary | ICD-10-CM | POA: Diagnosis not present

## 2020-08-14 DIAGNOSIS — R1030 Lower abdominal pain, unspecified: Secondary | ICD-10-CM | POA: Diagnosis not present

## 2020-08-18 DIAGNOSIS — E669 Obesity, unspecified: Secondary | ICD-10-CM | POA: Diagnosis not present

## 2020-08-18 DIAGNOSIS — K219 Gastro-esophageal reflux disease without esophagitis: Secondary | ICD-10-CM | POA: Diagnosis not present

## 2020-08-18 DIAGNOSIS — Z9884 Bariatric surgery status: Secondary | ICD-10-CM | POA: Diagnosis not present

## 2020-08-18 DIAGNOSIS — K581 Irritable bowel syndrome with constipation: Secondary | ICD-10-CM | POA: Diagnosis not present

## 2020-08-20 DIAGNOSIS — R69 Illness, unspecified: Secondary | ICD-10-CM | POA: Diagnosis not present

## 2020-08-21 DIAGNOSIS — Z8639 Personal history of other endocrine, nutritional and metabolic disease: Secondary | ICD-10-CM | POA: Diagnosis not present

## 2020-08-21 DIAGNOSIS — R5383 Other fatigue: Secondary | ICD-10-CM | POA: Diagnosis not present

## 2020-08-27 DIAGNOSIS — R69 Illness, unspecified: Secondary | ICD-10-CM | POA: Diagnosis not present

## 2020-09-02 DIAGNOSIS — R69 Illness, unspecified: Secondary | ICD-10-CM | POA: Diagnosis not present

## 2020-09-03 DIAGNOSIS — Z30430 Encounter for insertion of intrauterine contraceptive device: Secondary | ICD-10-CM | POA: Diagnosis not present

## 2020-09-10 DIAGNOSIS — R69 Illness, unspecified: Secondary | ICD-10-CM | POA: Diagnosis not present

## 2020-09-15 DIAGNOSIS — R69 Illness, unspecified: Secondary | ICD-10-CM | POA: Diagnosis not present

## 2020-09-20 DIAGNOSIS — R69 Illness, unspecified: Secondary | ICD-10-CM | POA: Diagnosis not present

## 2020-09-24 DIAGNOSIS — R21 Rash and other nonspecific skin eruption: Secondary | ICD-10-CM | POA: Diagnosis not present

## 2020-09-26 DIAGNOSIS — R69 Illness, unspecified: Secondary | ICD-10-CM | POA: Diagnosis not present

## 2020-09-29 DIAGNOSIS — R69 Illness, unspecified: Secondary | ICD-10-CM | POA: Diagnosis not present

## 2020-10-06 DIAGNOSIS — R69 Illness, unspecified: Secondary | ICD-10-CM | POA: Diagnosis not present

## 2020-10-09 DIAGNOSIS — Z30431 Encounter for routine checking of intrauterine contraceptive device: Secondary | ICD-10-CM | POA: Diagnosis not present

## 2020-10-12 DIAGNOSIS — R69 Illness, unspecified: Secondary | ICD-10-CM | POA: Diagnosis not present

## 2020-10-14 DIAGNOSIS — E6609 Other obesity due to excess calories: Secondary | ICD-10-CM | POA: Diagnosis not present

## 2020-10-14 DIAGNOSIS — Z79899 Other long term (current) drug therapy: Secondary | ICD-10-CM | POA: Diagnosis not present

## 2020-10-14 DIAGNOSIS — Z6837 Body mass index (BMI) 37.0-37.9, adult: Secondary | ICD-10-CM | POA: Diagnosis not present

## 2020-10-14 DIAGNOSIS — Z6838 Body mass index (BMI) 38.0-38.9, adult: Secondary | ICD-10-CM | POA: Diagnosis not present

## 2020-10-14 DIAGNOSIS — K219 Gastro-esophageal reflux disease without esophagitis: Secondary | ICD-10-CM | POA: Diagnosis not present

## 2020-10-14 DIAGNOSIS — R7309 Other abnormal glucose: Secondary | ICD-10-CM | POA: Diagnosis not present

## 2020-10-14 DIAGNOSIS — E538 Deficiency of other specified B group vitamins: Secondary | ICD-10-CM | POA: Diagnosis not present

## 2020-10-14 DIAGNOSIS — E282 Polycystic ovarian syndrome: Secondary | ICD-10-CM | POA: Diagnosis not present

## 2020-10-17 DIAGNOSIS — R69 Illness, unspecified: Secondary | ICD-10-CM | POA: Diagnosis not present

## 2020-12-18 ENCOUNTER — Other Ambulatory Visit: Payer: Self-pay

## 2020-12-18 ENCOUNTER — Encounter: Payer: Self-pay | Admitting: Plastic Surgery

## 2020-12-18 ENCOUNTER — Ambulatory Visit (INDEPENDENT_AMBULATORY_CARE_PROVIDER_SITE_OTHER): Payer: Managed Care, Other (non HMO) | Admitting: Plastic Surgery

## 2020-12-18 VITALS — BP 129/89 | HR 60 | Ht 68.0 in | Wt 253.6 lb

## 2020-12-18 DIAGNOSIS — M4004 Postural kyphosis, thoracic region: Secondary | ICD-10-CM | POA: Diagnosis not present

## 2020-12-18 DIAGNOSIS — M546 Pain in thoracic spine: Secondary | ICD-10-CM | POA: Diagnosis not present

## 2020-12-18 DIAGNOSIS — M545 Low back pain, unspecified: Secondary | ICD-10-CM

## 2020-12-18 DIAGNOSIS — M793 Panniculitis, unspecified: Secondary | ICD-10-CM | POA: Diagnosis not present

## 2020-12-18 NOTE — Progress Notes (Signed)
Referring Provider No referring provider defined for this encounter.   CC:  Chief Complaint  Patient presents with   Advice Only      [name] Gonzales is an 30 y.o. female.  HPI: Patient presents to discuss panniculectomy.  She recently had a gastric sleeve in 2017 and has lost almost 100 pounds from that procedure.  She is bothered by the overhanging abdominal skin.  She feels that she cannot get rid of this through exercise as with weight loss and only seems to be getting worse.  She has had what sounds like some mild hidradenitis in the past with some pustules in the axilla and the inframammary and infraumbilical areas that of been treated by her general surgeon.  She is a prediabetic with a hemoglobin A1c of 5.7.  She does vape with nicotine products but says she could stop.  She does have lower back pain that she feels like is contributed to by the overhanging skin and rashes that have been refractory to over-the-counter and steroid treatments.  Allergies  Allergen Reactions   Penicillins Hives and Swelling    Has patient had a PCN reaction causing immediate rash, facial/tongue/throat swelling, SOB or lightheadedness with hypotension: Yes Has patient had a PCN reaction causing severe rash involving mucus membranes or skin necrosis: No Has patient had a PCN reaction that required hospitalization: Yes Has patient had a PCN reaction occurring within the last 10 years: No     Outpatient Encounter Medications as of 12/18/2020  Medication Sig Note   albuterol (PROVENTIL HFA;VENTOLIN HFA) 108 (90 Base) MCG/ACT inhaler Inhale 2 puffs into the lungs every 4 (four) hours as needed for wheezing or shortness of breath (or coughing).    docusate sodium (COLACE) 100 MG capsule Take 1 capsule (100 mg total) by mouth every 12 (twelve) hours.    esomeprazole (NEXIUM) 20 MG capsule Take 20 mg by mouth at bedtime as needed (Heart burn).     gabapentin (NEURONTIN) 100 MG capsule Take 100 mg by mouth  at bedtime.     levonorgestrel (KYLEENA) 19.5 MG IUD by Intrauterine route.    naphazoline-pheniramine (NAPHCON-A) 0.025-0.3 % ophthalmic solution Place 1 drop into both eyes every 4 (four) hours as needed for eye irritation.    omeprazole (PRILOSEC) 20 MG capsule Take 1 capsule (20 mg total) by mouth daily.    pantoprazole (PROTONIX) 40 MG tablet Take 40 mg by mouth daily.    sucralfate (CARAFATE) 1 GM/10ML suspension Take 10 mLs (1 g total) by mouth 4 (four) times daily -  with meals and at bedtime.    Vitamin D, Ergocalciferol, (DRISDOL) 50000 units CAPS capsule Take 50,000 Units by mouth every 7 (seven) days.    Dulaglutide 1.5 MG/0.5ML SOPN INJECT 1.5 MG SUBCUTANEOUSLY ONCE A WEEK 12/01/2017: Pt does inj on Wed   metroNIDAZOLE (FLAGYL) 500 MG tablet Take 1 tablet (500 mg total) by mouth 2 (two) times daily.    spironolactone (ALDACTONE) 25 MG tablet Take 25 mg by mouth daily.    [DISCONTINUED] levonorgestrel-ethinyl estradiol (SEASONALE,INTROVALE,JOLESSA) 0.15-0.03 MG tablet Take 1 tablet by mouth daily.     No facility-administered encounter medications on file as of 12/18/2020.     Past Medical History:  Diagnosis Date   Bacterial vaginosis    Bronchitis    Chlamydia    Diabetes mellitus    GERD (gastroesophageal reflux disease)    Gonorrhea    Hidradenitis suppurativa    Neuromuscular disorder (HCC)    neuropathy  Paresthesia    hands, feet   Trichomoniasis    UTI (urinary tract infection)     Past Surgical History:  Procedure Laterality Date   LAPAROSCOPIC GASTRIC SLEEVE RESECTION N/A 02/03/2016   Procedure: LAPAROSCOPIC GASTRIC SLEEVE RESECTION WITH UPPER ENDO;  Surgeon: De Blanch Kinsinger, MD;  Location: [facility];  Service: General;  Laterality: N/A;   OOPHORECTOMY  11/2009   left   SALPINGOOPHORECTOMY     TONSILLECTOMY  2002    Family History  Problem Relation Age of Onset   Stroke Mother        2010   Heart attack Mother        2010   Hypertension Mother     Lupus Sister     Social History   Social History Narrative   Not on file     Review of Systems General: Denies fevers, chills, weight loss CV: Denies chest pain, shortness of breath, palpitations  Physical Exam Vitals with BMI 12/18/2020 07/27/2020 03/04/2019  Height 5\' 8"  - -  Weight 253 lbs 10 oz - -  BMI 38.57 - -  Systolic 129 123  Diastolic 89 75 69  Pulse 60 78 82    General:  No acute distress,  Alert and oriented, Non-Toxic, Normal speech and affect Abdomen: Abdomen is soft nontender.  I do not appreciate any obvious hernias.  She has a laparoscopic port site in the periumbilical area that is well-healed.  She has an overhanging pannus with signs of skin irritation beneath it.  She does have some intra-abdominal fullness that is contributing to her contour irregularities.  Assessment/Plan Patient is reasonable candidate for infraumbilical panniculectomy.  We discussed the risks and benefits of that procedure that include bleeding, infection, damage to surrounding structures need for additional procedures.  We discussed the expected postoperative course and the need for drains postoperatively.  We did discuss abdominoplasty for the upper portion but I did explain that the intra-abdominal fullness would limit the improvements to that area in some capacity.  She would like to hold off on the abdominoplasty for now and she may have kids in the future which makes that a good idea in my opinion.  We will plan to move forward with submitting the panniculectomy for approval.  All of her questions were answered.  353 12/18/2020, 10:42 AM

## 2020-12-23 ENCOUNTER — Telehealth: Payer: Self-pay | Admitting: Plastic Surgery

## 2020-12-23 NOTE — Telephone Encounter (Signed)
Called [employer] to check on insurance coverage, as the coverage is coming up inactive in [place], [place], and the [place] provider portal. Spoke with Carter Kitten, call reference # [id number], policy is inactive, terminated effective 10/19/2020.  I left a message for the patient regarding this information. Advised patient is welcome to call back to discuss this, and if policy information changes, verification of coverage/benefits can be faxed to our office at [phone].

## 2021-01-23 DIAGNOSIS — R69 Illness, unspecified: Secondary | ICD-10-CM | POA: Diagnosis not present

## 2021-01-30 DIAGNOSIS — R69 Illness, unspecified: Secondary | ICD-10-CM | POA: Diagnosis not present

## 2021-02-03 DIAGNOSIS — R69 Illness, unspecified: Secondary | ICD-10-CM | POA: Diagnosis not present

## 2021-02-06 DIAGNOSIS — R69 Illness, unspecified: Secondary | ICD-10-CM | POA: Diagnosis not present

## 2021-02-10 DIAGNOSIS — R69 Illness, unspecified: Secondary | ICD-10-CM | POA: Diagnosis not present

## 2021-02-13 DIAGNOSIS — R69 Illness, unspecified: Secondary | ICD-10-CM | POA: Diagnosis not present

## 2021-02-17 DIAGNOSIS — R69 Illness, unspecified: Secondary | ICD-10-CM | POA: Diagnosis not present

## 2021-02-21 DIAGNOSIS — R69 Illness, unspecified: Secondary | ICD-10-CM | POA: Diagnosis not present

## 2021-02-24 DIAGNOSIS — R69 Illness, unspecified: Secondary | ICD-10-CM | POA: Diagnosis not present

## 2021-02-28 DIAGNOSIS — R69 Illness, unspecified: Secondary | ICD-10-CM | POA: Diagnosis not present

## 2021-03-04 DIAGNOSIS — R69 Illness, unspecified: Secondary | ICD-10-CM | POA: Diagnosis not present

## 2021-03-12 DIAGNOSIS — N898 Other specified noninflammatory disorders of vagina: Secondary | ICD-10-CM | POA: Diagnosis not present

## 2021-03-17 DIAGNOSIS — Z6837 Body mass index (BMI) 37.0-37.9, adult: Secondary | ICD-10-CM | POA: Diagnosis not present

## 2021-03-17 DIAGNOSIS — E6609 Other obesity due to excess calories: Secondary | ICD-10-CM | POA: Diagnosis not present

## 2021-03-17 DIAGNOSIS — Z87898 Personal history of other specified conditions: Secondary | ICD-10-CM | POA: Diagnosis not present

## 2021-03-17 DIAGNOSIS — Z9884 Bariatric surgery status: Secondary | ICD-10-CM | POA: Diagnosis not present

## 2021-05-06 ENCOUNTER — Other Ambulatory Visit: Payer: Self-pay

## 2021-07-29 DIAGNOSIS — G629 Polyneuropathy, unspecified: Secondary | ICD-10-CM | POA: Diagnosis not present

## 2021-07-29 DIAGNOSIS — E6609 Other obesity due to excess calories: Secondary | ICD-10-CM | POA: Diagnosis not present

## 2021-07-29 DIAGNOSIS — Z6837 Body mass index (BMI) 37.0-37.9, adult: Secondary | ICD-10-CM | POA: Diagnosis not present

## 2021-07-29 DIAGNOSIS — E559 Vitamin D deficiency, unspecified: Secondary | ICD-10-CM | POA: Diagnosis not present

## 2021-07-29 DIAGNOSIS — R7303 Prediabetes: Secondary | ICD-10-CM | POA: Diagnosis not present

## 2021-09-07 DIAGNOSIS — Z6839 Body mass index (BMI) 39.0-39.9, adult: Secondary | ICD-10-CM | POA: Diagnosis not present

## 2021-09-07 DIAGNOSIS — R7303 Prediabetes: Secondary | ICD-10-CM | POA: Diagnosis not present

## 2021-09-07 DIAGNOSIS — E282 Polycystic ovarian syndrome: Secondary | ICD-10-CM | POA: Diagnosis not present

## 2021-09-07 DIAGNOSIS — E6609 Other obesity due to excess calories: Secondary | ICD-10-CM | POA: Diagnosis not present

## 2021-09-07 DIAGNOSIS — E538 Deficiency of other specified B group vitamins: Secondary | ICD-10-CM | POA: Diagnosis not present

## 2021-10-13 DIAGNOSIS — Z114 Encounter for screening for human immunodeficiency virus [HIV]: Secondary | ICD-10-CM | POA: Diagnosis not present

## 2021-10-13 DIAGNOSIS — Z975 Presence of (intrauterine) contraceptive device: Secondary | ICD-10-CM | POA: Diagnosis not present

## 2021-10-13 DIAGNOSIS — Z1159 Encounter for screening for other viral diseases: Secondary | ICD-10-CM | POA: Diagnosis not present

## 2021-10-13 DIAGNOSIS — E282 Polycystic ovarian syndrome: Secondary | ICD-10-CM | POA: Diagnosis not present

## 2021-10-13 DIAGNOSIS — Z118 Encounter for screening for other infectious and parasitic diseases: Secondary | ICD-10-CM | POA: Diagnosis not present

## 2021-10-13 DIAGNOSIS — Z01419 Encounter for gynecological examination (general) (routine) without abnormal findings: Secondary | ICD-10-CM | POA: Diagnosis not present

## 2021-10-13 DIAGNOSIS — Z113 Encounter for screening for infections with a predominantly sexual mode of transmission: Secondary | ICD-10-CM | POA: Diagnosis not present

## 2021-10-13 DIAGNOSIS — R69 Illness, unspecified: Secondary | ICD-10-CM | POA: Diagnosis not present

## 2021-10-29 DIAGNOSIS — E282 Polycystic ovarian syndrome: Secondary | ICD-10-CM | POA: Diagnosis not present

## 2021-10-29 DIAGNOSIS — R102 Pelvic and perineal pain: Secondary | ICD-10-CM | POA: Diagnosis not present

## 2021-10-29 DIAGNOSIS — Z880 Allergy status to penicillin: Secondary | ICD-10-CM | POA: Diagnosis not present

## 2021-10-29 DIAGNOSIS — Z90721 Acquired absence of ovaries, unilateral: Secondary | ICD-10-CM | POA: Diagnosis not present

## 2021-10-29 DIAGNOSIS — R69 Illness, unspecified: Secondary | ICD-10-CM | POA: Diagnosis not present

## 2021-10-29 DIAGNOSIS — R768 Other specified abnormal immunological findings in serum: Secondary | ICD-10-CM | POA: Diagnosis not present

## 2021-10-29 DIAGNOSIS — Z975 Presence of (intrauterine) contraceptive device: Secondary | ICD-10-CM | POA: Diagnosis not present

## 2023-12-14 NOTE — Progress Notes (Signed)
 REFERRING PHYSICIAN:  Self PROVIDER:  HERLENE BEVERLEY BUREAU, MD MRN: [id number] DOB: 1990-12-12 DATE OF ENCOUNTER: 12/14/2023 Subjective  Chief Complaint: New Consultation and Follow-up (Sleeve with Dr. BUREAU done 02/03/16)    History of Present Illness: [name] Gonzales is a 33 y.o. female who is seen today as an office consultation at the request of Dr. Arch for evaluation of New Consultation and Follow-up (Sleeve with Dr. BUREAU done 02/03/16) .    History of Present Illness [name] Gonzales is a 33 year old female with a history of acid reflux and PCOS who presents with worsening acid reflux and constipation.  Over the past two years, she has experienced worsening acid reflux and heartburn. Despite taking pantoprazole, she sometimes requires two doses or additional over-the-counter medications to manage her symptoms. She experiences frequent bloating and a sensation of food being stuck in her chest. Her reflux is somewhat alleviated by cutting out juice, though soda occasionally helps her burp.  She experiences significant constipation, with bowel movements occurring every other day or every two days with the use of Ducalex and Colace. Without laxatives, she might not have a bowel movement for a week to a week and a half. Despite these measures, she continues to experience bloating. Fiber supplements have provided minimal relief.  Her diet has changed due to her job, which involves travel, leading her to eat whatever is available. She avoids beef due to difficulty digesting it post-surgery and consumes chicken, broccoli, and lettuce. She drinks mainly water , with occasional light juices and diet sodas.  Her weight has fluctuated, particularly after switching to an IUD in 2022, which she believes has affected her hormonal balance and increased her weight. She was previously on Wegovy, which helped maintain her weight, but insurance changes have affected her access to this  medication.  24 h recall: Breakfast: egg biscuit Lunch: skipped Dinner: chicken wings Other: none Drinks: water , juice or diet soda 2 times a week  GI history: She has reflux daily despite medication. She has dysphagia never. She does not have IBS or IBD. She has had a sleeve in 2017  Cardiac history: She does not have history of angina, MI, CVA, TIA She does not have history of PE or VTE She does not have OSA  Review of Systems: A complete review of systems was obtained from the patient.  I have reviewed this information and discussed as appropriate with the patient.  See HPI as well for other ROS.  Medical History: Past Medical History:  Diagnosis Date  . GERD (gastroesophageal reflux disease)    Patient Active Problem List  Diagnosis  . Diabetes mellitus (CMS/HHS-HCC)  . Gastroesophageal reflux disease without esophagitis  . Hidradenitis suppurativa  . Irritable bowel syndrome with constipation  . Neuropathy  . History of left oophorectomy  . PCOS (polycystic ovarian syndrome)  . S/P laparoscopic sleeve gastrectomy   Past Surgical History:  Procedure Laterality Date  . oopherectomy Left   . TONSILLECTOMY      Allergies  Allergen Reactions  . Penicillins Anaphylaxis, Hives and Swelling    Has patient had a PCN reaction causing immediate rash, facial/tongue/throat swelling, SOB or lightheadedness with hypotension: Yes  Has patient had a PCN reaction causing severe rash involving mucus membranes or skin necrosis: No  Has patient had a PCN reaction that required hospitalization: Yes  Has patient had a PCN reaction occurring within the last 10 years: No  Has patient had a PCN reaction causing immediate rash, facial/tongue/throat swelling,  SOB or lightheadedness with hypotension: Yes, Has patient had a PCN reaction causing severe rash involving mucus membranes or skin necrosis: No, Has patient had a PCN reaction that required hospitalization: Yes, Has patient had a  PCN reaction occurring within the last 10 years: No   Current Outpatient Medications on File Prior to Visit  Medication Sig Dispense Refill  . famotidine (PEPCID) 20 MG tablet Take 20 mg by mouth 2 (two) times daily    . fluconazole  (DIFLUCAN ) 150 MG tablet     . levonorgestreL  (KYLEENA ) 17.5 mcg/24 hr (5 yrs) 19.5 mg IUD Insert into the uterus    . pantoprazole (PROTONIX) 40 MG DR tablet Take 40 mg by mouth once daily    . phentermine (ADIPEX-P) 37.5 mg tablet Take by mouth daily    . spironolactone (ALDACTONE) 25 MG tablet Take 1 tablet by mouth once daily    . metFORMIN (GLUCOPHAGE) 500 MG tablet Take 500 mg by mouth 2 (two) times daily with meals    . omeprazole  (PRILOSEC) 20 MG DR capsule Take 1 capsule by mouth once daily (Patient not taking: Reported on 12/14/2023)     No current facility-administered medications on file prior to visit.   Family History  Problem Relation Age of Onset  . Stroke Mother   . High blood pressure (Hypertension) Mother   . Hyperlipidemia (Elevated cholesterol) Mother   . Coronary Artery Disease (Blocked arteries around heart) Mother   . Diabetes Mother   . Breast cancer Mother   . Obesity Father   . High blood pressure (Hypertension) Father   . Diabetes Father   . Stroke Sister   . Obesity Sister   . Coronary Artery Disease (Blocked arteries around heart) Sister     Social History   Tobacco Use  Smoking Status Former  . Types: Cigarettes  Smokeless Tobacco Never    Social History   Socioeconomic History  . Marital status: Unknown  Tobacco Use  . Smoking status: Former    Types: Cigarettes  . Smokeless tobacco: Never  Vaping Use  . Vaping status: Unknown  Substance and Sexual Activity  . Alcohol use: Yes    Alcohol/week: 0.0 - 1.0 standard drinks of alcohol  . Drug use: Never   Social Drivers of Health    Received from [employer]   Social Network   Objective:   Vitals:   12/14/23 0936  BP: 121/84  Pulse: 80  Temp: 36.9  C (98.5 F)  SpO2: 99%  Weight: (!) 123.3 kg (271 lb 12.8 oz)  Height: 172.7 cm (5' 8)  PainSc: 0-No pain    Body mass index is 41.33 kg/m.  Physical Exam Constitutional:      Appearance: Normal appearance.  HENT:     Head: Normocephalic and atraumatic.  Pulmonary:     Effort: Pulmonary effort is normal.   Musculoskeletal:        General: Normal range of motion.     Cervical back: Normal range of motion.   Neurological:     General: No focal deficit present.     Mental Status: She is alert and oriented to person, place, and time. Mental status is at baseline.   Psychiatric:        Mood and Affect: Mood normal.        Behavior: Behavior normal.        Thought Content: Thought content normal.       Assessment and Plan:  Diagnoses and all orders for this  visit:  Morbid (severe) obesity due to excess calories (CMS/HHS-HCC) -     T4+Free T4 - [facility]; Future -     Thyroid  Stimulating Hormone (TSH); Future -     H. pylori Breath Test - [facility]; Future -     Iron and Total Iron Binding Capacity (TIBC); Future -     Folate; Future -     Prothrombin Time (INR); Future -     X-ray chest PA and lateral; Future -     X-ray upper GI; Future -     ECG 12-lead; Future -     Ambulatory Referral to Nutrition -     Ambulatory Referral to Adult Behavioral Health  S/P laparoscopic sleeve gastrectomy -     T4+Free T4 - [facility]; Future -     Thyroid  Stimulating Hormone (TSH); Future -     H. pylori Breath Test - [facility]; Future -     Iron and Total Iron Binding Capacity (TIBC); Future -     Folate; Future -     Prothrombin Time (INR); Future -     X-ray chest PA and lateral; Future -     X-ray upper GI; Future -     ECG 12-lead; Future -     Ambulatory Referral to Nutrition -     Ambulatory Referral to Adult Behavioral Health  Malnutrition following gastrointestinal surgery (HHS-HCC) -     T4+Free T4 - [facility]; Future -     Thyroid  Stimulating Hormone (TSH); Future -      H. pylori Breath Test - [facility]; Future -     Iron and Total Iron Binding Capacity (TIBC); Future -     Folate; Future -     Prothrombin Time (INR); Future -     X-ray chest PA and lateral; Future -     X-ray upper GI; Future -     ECG 12-lead; Future -     Ambulatory Referral to Nutrition -     Ambulatory Referral to Adult Behavioral Health  Prediabetes -     T4+Free T4 - [facility]; Future -     Thyroid  Stimulating Hormone (TSH); Future -     H. pylori Breath Test - [facility]; Future -     Iron and Total Iron Binding Capacity (TIBC); Future -     Folate; Future -     Prothrombin Time (INR); Future -     X-ray chest PA and lateral; Future -     X-ray upper GI; Future -     ECG 12-lead; Future -     Ambulatory Referral to Nutrition -     Ambulatory Referral to Adult Behavioral Health  PCOS (polycystic ovarian syndrome)  Hidradenitis suppurativa    Assessment & Plan Gastroesophageal reflux disease (GERD) Worsening reflux and heartburn despite pantoprazole. Considering gastric bypass to address reflux and aid weight loss. Explained procedure and risks including blood clots, leak, bleeding, wound infection, hernia, vitamin deficiencies, dumping syndrome, constipation, hypoglycemia, marginal ulcers, and internal hernia or bowel obstruction. - Discussed conversion to gastric bypass. - Ordered x-ray of swallowing to assess sleeve anatomy. - Coordinated with endoscopy for additional tests to confirm reflux. - Discussed risks of gastric bypass.  Obesity Obesity with weight fluctuations. Wegovy ineffective and not covered by insurance. Considering gastric bypass for additional weight loss. - Discussed conversion to gastric bypass. - Coordinated with dietitian and psychology for pre-surgical evaluation. - Ordered basic labs.  Chronic constipation Chronic constipation requiring laxatives and stool softeners.  Bloating persists despite fiber supplementation. Concern about exacerbation with  phentermine.  Polycystic ovary syndrome (PCOS) PCOS with hormonal imbalances affecting weight. Switched to IUD for birth control, possibly contributing to weight gain and cravings.  Prediabetes Prediabetes with insurance coverage issues for weight management medications. Current insurance does not cover Wegovy or Zepbound.  Follow-up Follow-up required for surgical planning and evaluation. - Scheduled follow-up appointments during pre-surgical process. - Discussed with [place] about working towards conversion for reflux and weight loss. 33 y.o. female with long history of obesity. She is most interested in conversion of sleeve to gastric bypass for reflux and weight regain concerns. I think this is a reasonable path forward and we will work toward that goal.   The patient meets weight loss surgery criteria. Due to the above reasons, I think minimally invasive RNY gastric bypass is the best option for the patient.   We discussed RNY gastric bypass. We discussed the preoperative, operative and postoperative process. I explained the surgery in detail including the performance of an EGD near the end of the surgery to test for leak. We discussed the typical hospital course including a 1-2 day stay baring any complications.   The patient was given Agricultural engineer. We did discuss the possibility of weight regain several years after the procedure.  The risks of infection, bleeding, pain, scarring, weight regain, too little or too much weight loss, vitamin deficiencies and need for lifelong vitamin supplementation, hair loss, need for protein supplementation, leaks, stricture, reflux, food intolerance, gallstone formation, hernia, need for reoperation, need for open surgery, injury to spleen or surrounding structures, DVT's, PE, and death again discussed with the patient and the patient expressed understanding and desires to proceed with laparoscopic Roux en Y gastric bypass, possible open,  intraoperative endoscopy.  We discussed that before and after surgery that there would be an alteration in their diet. I explained that we may put them on a diet 2 weeks before surgery. I also explained that they would be on a liquid diet for 2 weeks after surgery. We discussed that they would have to avoid certain foods after surgery. We discussed the importance of physical activity as well as compliance with our dietary and supplement recommendations and routine follow-up.    HERLENE BEVERLEY BUREAU, MD

## 2023-12-19 ENCOUNTER — Other Ambulatory Visit (HOSPITAL_COMMUNITY): Payer: Self-pay | Admitting: General Surgery

## 2023-12-19 DIAGNOSIS — Z9884 Bariatric surgery status: Secondary | ICD-10-CM

## 2023-12-19 DIAGNOSIS — K912 Postsurgical malabsorption, not elsewhere classified: Secondary | ICD-10-CM

## 2024-01-06 ENCOUNTER — Encounter (HOSPITAL_COMMUNITY): Payer: Self-pay

## 2024-01-06 ENCOUNTER — Encounter (HOSPITAL_COMMUNITY): Admission: RE | Admit: 2024-01-06 | Source: Ambulatory Visit

## 2024-01-06 ENCOUNTER — Inpatient Hospital Stay (HOSPITAL_COMMUNITY): Admission: RE | Admit: 2024-01-06 | Source: Ambulatory Visit

## 2024-01-09 ENCOUNTER — Ambulatory Visit: Admitting: Dietician

## 2024-07-03 ENCOUNTER — Other Ambulatory Visit

## 2024-07-03 ENCOUNTER — Ambulatory Visit (INDEPENDENT_AMBULATORY_CARE_PROVIDER_SITE_OTHER): Admitting: "Endocrinology

## 2024-07-03 ENCOUNTER — Encounter: Payer: Self-pay | Admitting: "Endocrinology

## 2024-07-03 NOTE — Patient Instructions (Signed)
 Pt interested in weight loss Discussed lifestyle changes, medical management as well as bariatric surgery Maintain healthy lifestyle including 1200 Cal/day, 30 min of activity/day, avoiding refined/processed/outside food 20 minutes physical activity per day, in continuum or interruptedly through the day  Goals: less than 60 grams of carbohydrate/meal, 1200-1500 Cal/day, 10,0000 steps a day and weight loss of 0.5-1 lb/ wk  Sleep 7-9 hours/day, adapt good sleep hygiene Adapt de-stressing and relaxation techniques to prevent stress induced weight gain Avoid/switch medications that lead to weight gain by discussing with the prescribing physician

## 2024-07-03 NOTE — Progress Notes (Signed)
 "    Outpatient Endocrinology Note [name] Birmingham, MD    [name] Gonzales [birth year] [id number]  Referring Provider: Tammy Tari [name] Primary Care Provider: Patient, No Pcp Per Reason for consultation: Subjective   Assessment & Plan  Diagnoses and all orders for this visit:  Morbid obesity (HCC) -     Amb Ref to Medical Weight Management -     Lipid panel -     Insulin , random -     Hemoglobin A1c -     Comprehensive metabolic panel with GFR   Patient is seeking help with her weight, currently morbidly obese with BMI of 40.75 and weight of 268 She has a history of gastric sleeve in 2017 with her highest weight being 330 pounds lowest weight to 20 pounds She is currently on phentermine and topiramate for 2 months but has not seen any benefit Patient reports that obesity medications of bariatric surgery is not covered by her current insurance She had followed up with her bariatric surgeon and was told she could potentially have another surgery, especially since she has GERD, however patient is currently reported by coverage issues Discussed lifestyle changes, medical management as well as bariatric surgery Maintain healthy lifestyle including 30 min of activity/day, avoiding refined/processed/outside food 20 minutes physical activity per day, in continuum or interruptedly through the day  Goals: less than 60 grams of carbohydrate/meal, 1200-1500 Cal/day, 10,0000 steps a day and weight loss of 0.5-1 lb/ wk  Sleep 7-9 hours/day, adapt good sleep hygiene Adapt de-stressing and relaxation techniques to prevent stress induced weight gain Avoid/switch medications that lead to weight gain by discussing with the prescribing physician  Continue phentermine and topiramate, start calorie counting, calorie counting apps provided and tips discussed to improve physical activity practically Referred patient to medical weight loss [name]  Return for labs today.   I have  reviewed current medications, nurse's notes, allergies, vital signs, past medical and surgical history, family medical history, and social history for this encounter. Counseled patient on symptoms, examination findings, lab findings, imaging results, treatment decisions and monitoring and prognosis. The patient understood the recommendations and agrees with the treatment plan. All questions regarding treatment plan were fully answered.  [name] Birmingham, MD  07/03/2024   History of Present Illness HPI  [name] Gonzales is a 34 y.o. year old female who presents for evaluation of morbid obesity.  Reports history of PCOS, excess weight s/p gastric sleeve in 2017 (highest weight 330lbs->lowest was 220 lbs) Reports increased appetite and skin changes  Saw endocrinologist for a long time  She is on phenteramine 37.5 and topiramate 25 mg every day: been on it since 04/2024 and denies any weight loss  Reports neuropathy  Physical Exam  BP 120/80   Pulse 72   Ht 5' 8 (1.727 m)   Wt 268 lb (121.6 kg)   SpO2 98%   BMI 40.75 kg/m    Constitutional: well developed, well nourished Head: normocephalic, atraumatic Eyes: sclera anicteric, no redness Neck: supple Lungs: normal respiratory effort Neurology: alert and oriented Skin: dry, no appreciable rashes Musculoskeletal: no appreciable defects Psychiatric: normal mood and affect   Current Medications Patient's Medications  New Prescriptions   No medications on file  Previous Medications   ALBUTEROL  (PROVENTIL  HFA;VENTOLIN  HFA) 108 (90 BASE) MCG/ACT INHALER    Inhale 2 puffs into the lungs every 4 (four) hours as needed for wheezing or shortness of breath (or coughing).   DOCUSATE SODIUM  (COLACE) 100 MG CAPSULE  Take 1 capsule (100 mg total) by mouth every 12 (twelve) hours.   DULAGLUTIDE 1.5 MG/0.5ML SOPN    INJECT 1.5 MG SUBCUTANEOUSLY ONCE A WEEK   ESOMEPRAZOLE (NEXIUM) 20 MG CAPSULE    Take 20 mg by mouth at bedtime as needed (Heart  burn).    GABAPENTIN (NEURONTIN) 100 MG CAPSULE    Take 100 mg by mouth at bedtime.    LEVONORGESTREL  (KYLEENA ) 19.5 MG IUD    by Intrauterine route.   LEVONORGESTREL -ETH ESTRADIOL (TWIRLA TD)    1 tablet daily.   METRONIDAZOLE  (FLAGYL ) 500 MG TABLET    Take 1 tablet (500 mg total) by mouth 2 (two) times daily.   NAPHAZOLINE-PHENIRAMINE (NAPHCON-A) 0.025-0.3 % OPHTHALMIC SOLUTION    Place 1 drop into both eyes every 4 (four) hours as needed for eye irritation.   OMEPRAZOLE  (PRILOSEC) 20 MG CAPSULE    Take 1 capsule (20 mg total) by mouth daily.   PANTOPRAZOLE (PROTONIX) 40 MG TABLET    Take 40 mg by mouth daily.   SPIRONOLACTONE (ALDACTONE) 25 MG TABLET    Take 25 mg by mouth daily.   SUCRALFATE  (CARAFATE ) 1 GM/10ML SUSPENSION    Take 10 mLs (1 g total) by mouth 4 (four) times daily -  with meals and at bedtime.   VITAMIN D, ERGOCALCIFEROL, (DRISDOL) 50000 UNITS CAPS CAPSULE    Take 50,000 Units by mouth every 7 (seven) days.  Modified Medications   No medications on file  Discontinued Medications   No medications on file    Allergies Allergies[1]  Past Medical History Past Medical History:  Diagnosis Date   Bacterial vaginosis    Bronchitis    Chlamydia    Diabetes mellitus    GERD (gastroesophageal reflux disease)    Gonorrhea    Hidradenitis suppurativa    Neuromuscular disorder (HCC)    neuropathy   Paresthesia    hands, feet   Trichomoniasis    UTI (urinary tract infection)     Past Surgical History Past Surgical History:  Procedure Laterality Date   LAPAROSCOPIC GASTRIC SLEEVE RESECTION N/A 02/03/2016   Procedure: LAPAROSCOPIC GASTRIC SLEEVE RESECTION WITH UPPER ENDO;  Surgeon: Herlene Righter Kinsinger, MD;  Location: [facility];  Service: General;  Laterality: N/A;   OOPHORECTOMY  11/2009   left   SALPINGOOPHORECTOMY     TONSILLECTOMY  2002    Family History family history includes Heart attack in her mother; Hypertension in her mother; Lupus in her sister; Stroke in  her mother.  Social History Social History   Socioeconomic History   Marital status: Single    Spouse name: Not on file   Number of children: Not on file   Years of education: Not on file   Highest education level: Not on file  Occupational History   Not on file  Tobacco Use   Smoking status: Every Day    Types: E-cigarettes   Smokeless tobacco: Never  Substance and Sexual Activity   Alcohol use: Yes    Alcohol/week: 1.0 standard drink of alcohol    Types: 1 Standard drinks or equivalent per week   Drug use: No   Sexual activity: Yes  Other Topics Concern   Not on file  Social History Narrative   Not on file   Social Drivers of Health   Tobacco Use: High Risk (07/03/2024)   Patient History    Smoking Tobacco Use: Every Day    Smokeless Tobacco Use: Never    Passive Exposure: Not on  file  Financial Resource Strain: Not on file  Food Insecurity: Low Risk (02/01/2024)   Received from [facility]   Epic    Within the past 12 months, you worried that your food would run out before you got money to buy more: Never true    Within the past 12 months, the food you bought just didn't last and you didn't have money to get more. : Never true  Transportation Needs: Not on file (02/01/2024)  Physical Activity: Not on file  Stress: Not on file  Social Connections: Unknown (10/29/2021)   [facility]   Social Network    Social Network: Not on file  Intimate Partner Violence: Unknown (09/24/2021)   Received from [facility]   HITS    Physically Hurt: Not on file    Insult or Talk Down To: Not on file    Threaten Physical Harm: Not on file    Scream or Curse: Not on file  Depression (EYV7-0): Not on file  Alcohol Screen: Not on file  Housing: Low Risk (02/01/2024)   Received from [facility]   Epic    What is your living situation today?: I have a steady place to live    Think about the place you live. Do you have problems with any of the following? Choose all  that apply:: None/None on this list  Utilities: Unknown (02/01/2024)   Received from [facility]   Utilities    In the past 12 months has the electric, gas, oil, or water  company threatened to shut off services in your home? : Patient declined to answer  Health Literacy: Not on file    No results found for: CHOL No results found for: HDL No results found for: LDLCALC No results found for: TRIG No results found for: [facility] Results  Component Value Date   CREATININE 0.60 05/07/2018   No results found for: GFR    Component Value Date/Time   NA 138 05/07/2018 1636   NA 140 11/26/2012 2105   K 3.4 (L) 05/07/2018 1636   K 3.4 (L) 11/26/2012 2105   CL 110 05/07/2018 1636   CL 107 11/26/2012 2105   CO2 21 (L) 05/07/2018 1636   CO2 27 11/26/2012 2105   GLUCOSE 107 (H) 05/07/2018 1636   GLUCOSE 104 (H) 11/26/2012 2105   BUN 6 05/07/2018 1636   BUN 8 11/26/2012 2105   CREATININE 0.60 05/07/2018 1636   CREATININE 0.74 11/26/2012 2105   CALCIUM 9.0 05/07/2018 1636   CALCIUM 8.5 (L) 11/26/2012 2105   PROT 6.4 (L) 05/07/2018 1636   ALBUMIN 3.7 05/07/2018 1636   AST 21 05/07/2018 1636   ALT 16 05/07/2018 1636   ALKPHOS 39 05/07/2018 1636   BILITOT 0.5 05/07/2018 1636   GFRNONAA >60 05/07/2018 1636   GFRNONAA >60 11/26/2012 2105   GFRAA >60 05/07/2018 1636   GFRAA >60 11/26/2012 2105      Latest Ref Rng & Units 05/07/2018    4:36 PM 12/01/2017    4:42 AM 02/04/2016    5:07 AM  BMP  Glucose 70 - 99 mg/dL 892  82  90   BUN 6 - 20 mg/dL 6  8  8    Creatinine 0.44 - 1.00 mg/dL 9.39  9.49  9.45   Sodium 135 - 145 mmol/L 138  143  136   Potassium 3.5 - 5.1 mmol/L 3.4  3.5  3.8   Chloride 98 - 111 mmol/L 110  110  100  CO2 22 - 32 mmol/L 21  25  26    Calcium 8.9 - 10.3 mg/dL 9.0  8.9  9.0        Component Value Date/Time   WBC 6.9 05/07/2018 1636   RBC 5.06 05/07/2018 1636   HGB 12.4 05/07/2018 1636   HGB 12.2 11/26/2012 2105   HCT 42.3 05/07/2018 1636    HCT 37.3 11/26/2012 2105   PLT 376 05/07/2018 1636   PLT 363 11/26/2012 2105   MCV 83.6 05/07/2018 1636   MCV 78 (L) 11/26/2012 2105   MCH 24.5 (L) 05/07/2018 1636   MCHC 29.3 (L) 05/07/2018 1636   RDW 15.3 05/07/2018 1636   RDW 15.5 (H) 11/26/2012 2105   LYMPHSABS 1.5 02/04/2016 0507   MONOABS 0.7 02/04/2016 0507   EOSABS 0.0 02/04/2016 0507   BASOSABS 0.0 02/04/2016 0507   Lab Results  Component Value Date   TSH 0.777 05/07/2018         Parts of this note may have been dictated using voice recognition software. There may be variances in spelling and vocabulary which are unintentional. Not all errors are proofread. Please notify the dino if any discrepancies are noted or if the meaning of any statement is not clear.      [1]  Allergies Allergen Reactions   Penicillins Hives and Swelling    Has patient had a PCN reaction causing immediate rash, facial/tongue/throat swelling, SOB or lightheadedness with hypotension: Yes Has patient had a PCN reaction causing severe rash involving mucus membranes or skin necrosis: No Has patient had a PCN reaction that required hospitalization: Yes Has patient had a PCN reaction occurring within the last 10 years: No    "

## 2024-07-04 ENCOUNTER — Ambulatory Visit: Payer: Self-pay | Admitting: "Endocrinology

## 2024-07-04 LAB — COMPREHENSIVE METABOLIC PANEL WITH GFR
AG Ratio: 1.5 (calc) (ref 1.0–2.5)
ALT: 10 U/L (ref 6–29)
AST: 13 U/L (ref 10–30)
Albumin: 4.5 g/dL (ref 3.6–5.1)
Alkaline phosphatase (APISO): 38 U/L (ref 31–125)
BUN: 9 mg/dL (ref 7–25)
CO2: 26 mmol/L (ref 20–32)
Calcium: 9.1 mg/dL (ref 8.6–10.2)
Chloride: 106 mmol/L (ref 98–110)
Creat: 0.63 mg/dL (ref 0.50–0.97)
Globulin: 3 g/dL (ref 1.9–3.7)
Glucose, Bld: 80 mg/dL (ref 65–99)
Potassium: 4.2 mmol/L (ref 3.5–5.3)
Sodium: 140 mmol/L (ref 135–146)
Total Bilirubin: 0.4 mg/dL (ref 0.2–1.2)
Total Protein: 7.5 g/dL (ref 6.1–8.1)
eGFR: 122 mL/min/1.73m2

## 2024-07-04 LAB — LIPID PANEL
Cholesterol: 126 mg/dL
HDL: 66 mg/dL
LDL Cholesterol (Calc): 44 mg/dL
Non-HDL Cholesterol (Calc): 60 mg/dL
Total CHOL/HDL Ratio: 1.9 (calc)
Triglycerides: 84 mg/dL

## 2024-07-04 LAB — INSULIN, RANDOM: Insulin: 18.9 u[IU]/mL — ABNORMAL HIGH

## 2024-07-04 LAB — HEMOGLOBIN A1C
Hgb A1c MFr Bld: 5.6 %
Mean Plasma Glucose: 114 mg/dL
eAG (mmol/L): 6.3 mmol/L

## 2024-12-31 ENCOUNTER — Ambulatory Visit: Admitting: Physician Assistant
